# Patient Record
Sex: Male | Born: 1975 | Race: White | Hispanic: No | Marital: Married | Smoking: Former smoker
Health system: Southern US, Community
[De-identification: ages and names within clinical notes are randomized; demographics above are authoritative.]

## PROBLEM LIST (undated history)

## (undated) DIAGNOSIS — D649 Anemia, unspecified: Secondary | ICD-10-CM

## (undated) DIAGNOSIS — M940 Chondrocostal junction syndrome [Tietze]: Secondary | ICD-10-CM

## (undated) DIAGNOSIS — B019 Varicella without complication: Secondary | ICD-10-CM

## (undated) DIAGNOSIS — R51 Headache: Secondary | ICD-10-CM

## (undated) DIAGNOSIS — F419 Anxiety disorder, unspecified: Secondary | ICD-10-CM

## (undated) DIAGNOSIS — J45909 Unspecified asthma, uncomplicated: Secondary | ICD-10-CM

## (undated) DIAGNOSIS — K219 Gastro-esophageal reflux disease without esophagitis: Secondary | ICD-10-CM

## (undated) DIAGNOSIS — R519 Headache, unspecified: Secondary | ICD-10-CM

## (undated) HISTORY — PX: HERNIA REPAIR: SHX51

## (undated) HISTORY — PX: OTHER SURGICAL HISTORY: SHX169

---

## 2008-08-29 ENCOUNTER — Emergency Department: Payer: Self-pay | Admitting: Emergency Medicine

## 2008-09-01 ENCOUNTER — Emergency Department: Payer: Self-pay | Admitting: Emergency Medicine

## 2010-07-06 ENCOUNTER — Ambulatory Visit: Payer: Self-pay | Admitting: Gastroenterology

## 2010-07-10 LAB — PATHOLOGY REPORT

## 2012-06-05 ENCOUNTER — Ambulatory Visit: Payer: Self-pay | Admitting: Internal Medicine

## 2014-04-12 IMAGING — US ABDOMEN ULTRASOUND
1 series · 14 of 25 positions shown · non-contrast
Comparison: none

REASON FOR EXAM: Abd pain
COMMENTS:

PROCEDURE:     US  - US ABDOMEN GENERAL SURVEY  - June 05, 2012  [DATE]
RESULT:     Comparison: None
TECHNIQUE: Multiple gray-scale and color-flow Doppler images of the abdomen
are presented for review.

[Series 1: abdomen ultrasound · 0.31mm/px · 14 of 84 slices shown]
[im 1/84]
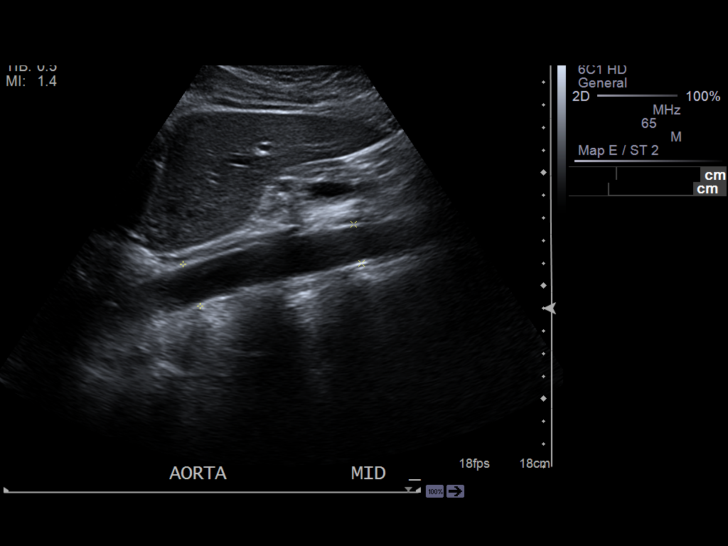
[im 7/84]
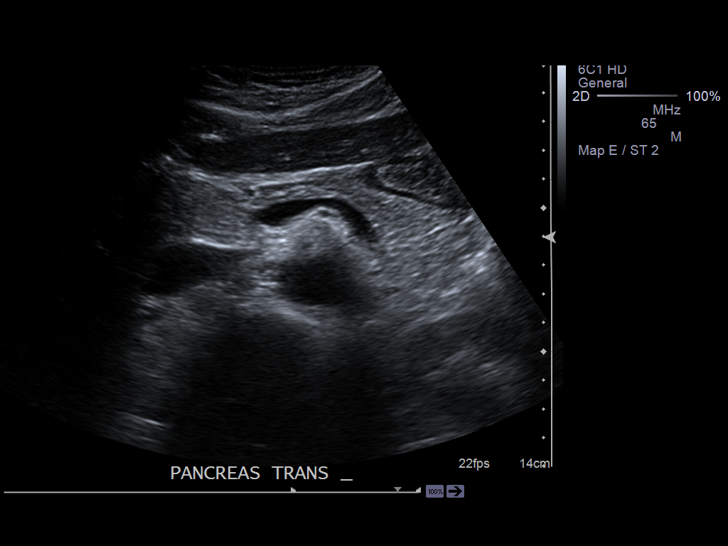
[im 14/84]
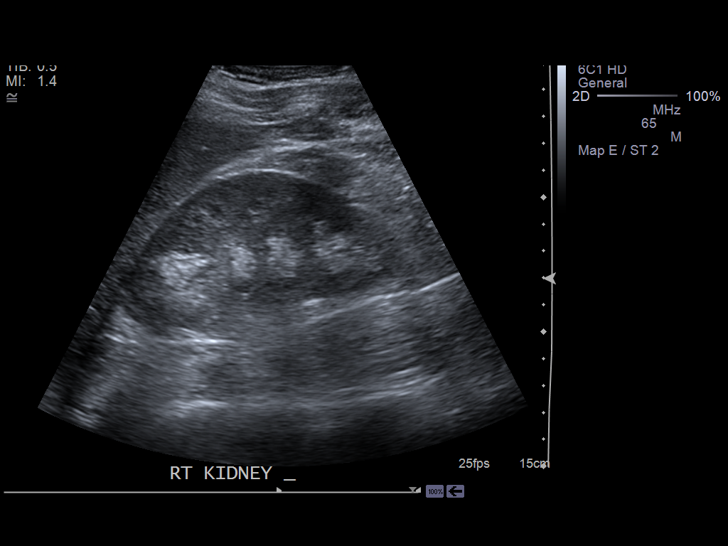
[im 21/84]
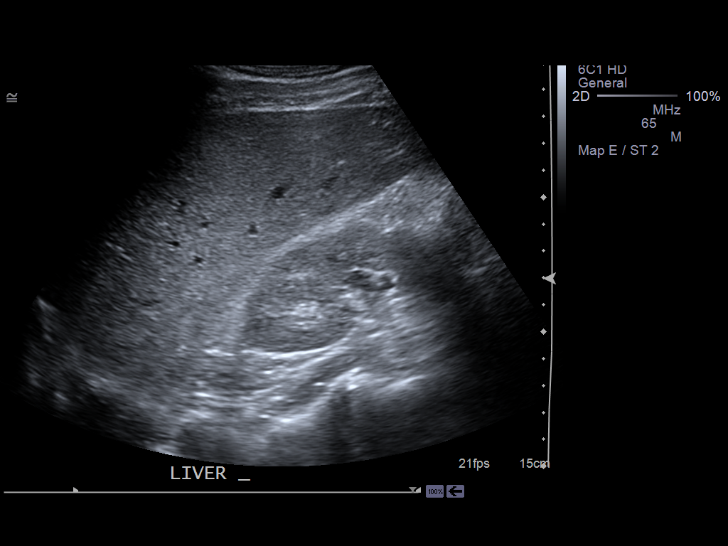
[im 28/84]
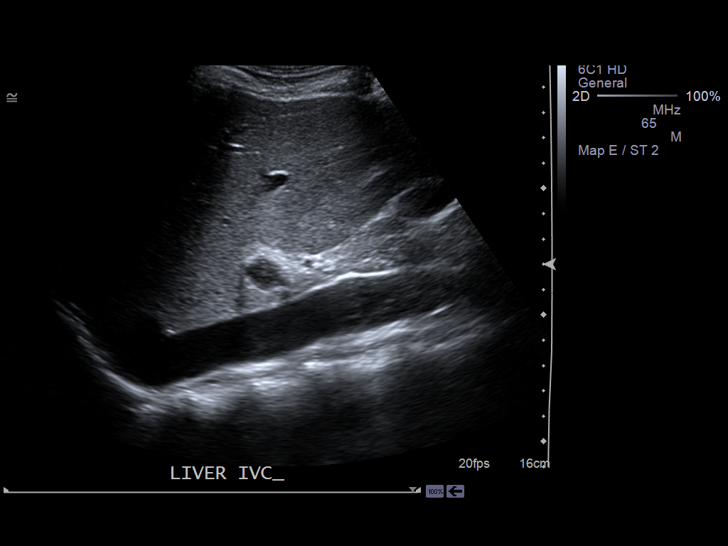
[im 32/84]
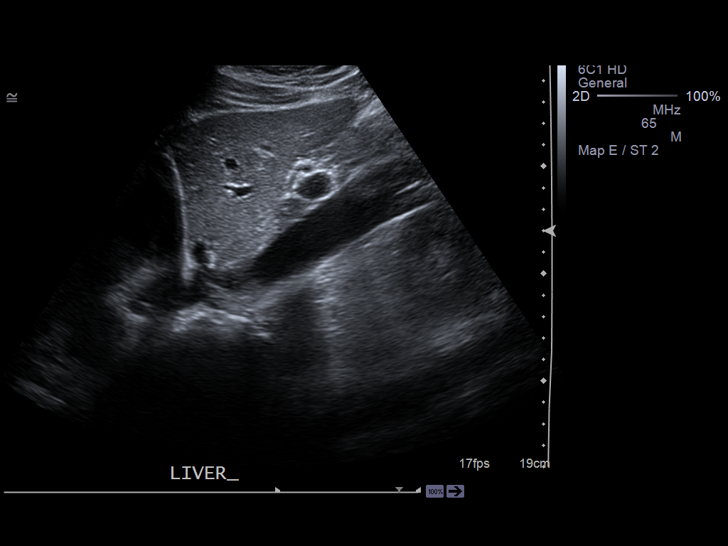
[im 39/84]
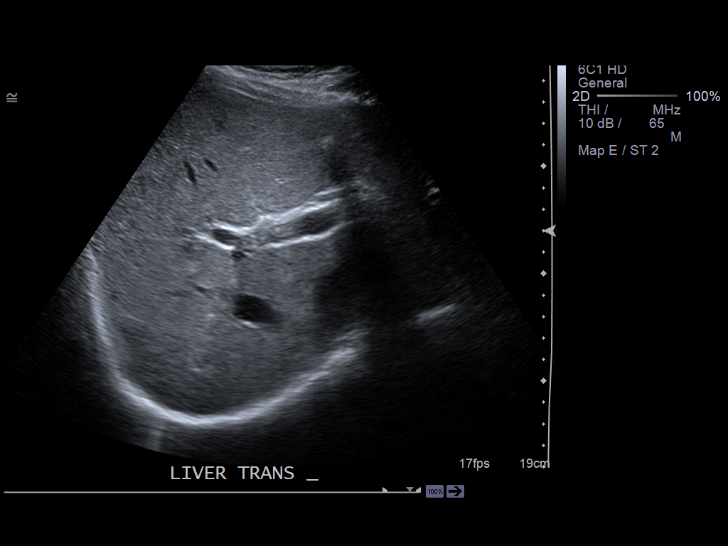
[im 45/84]
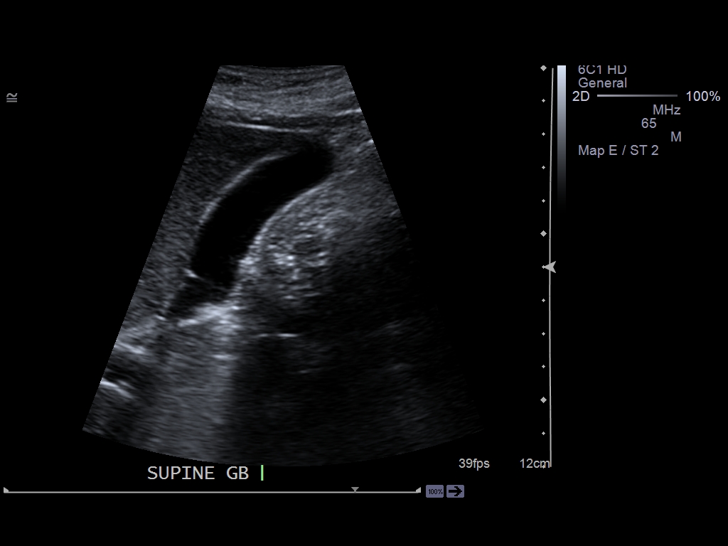
[im 52/84]
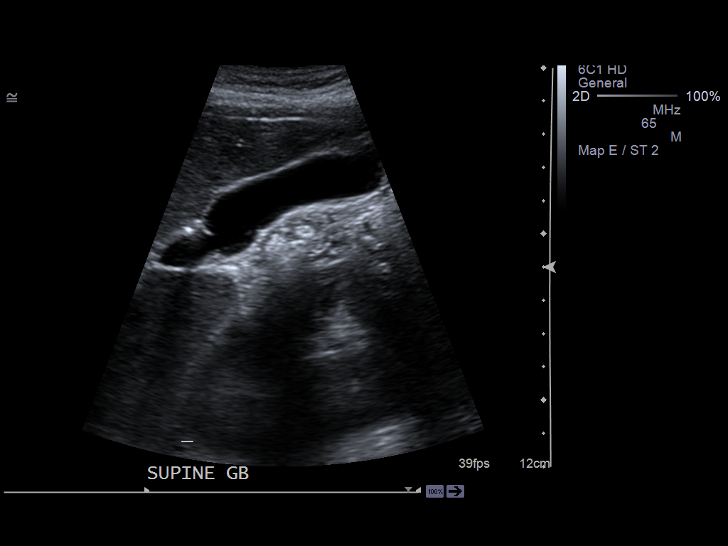
[im 56/84]
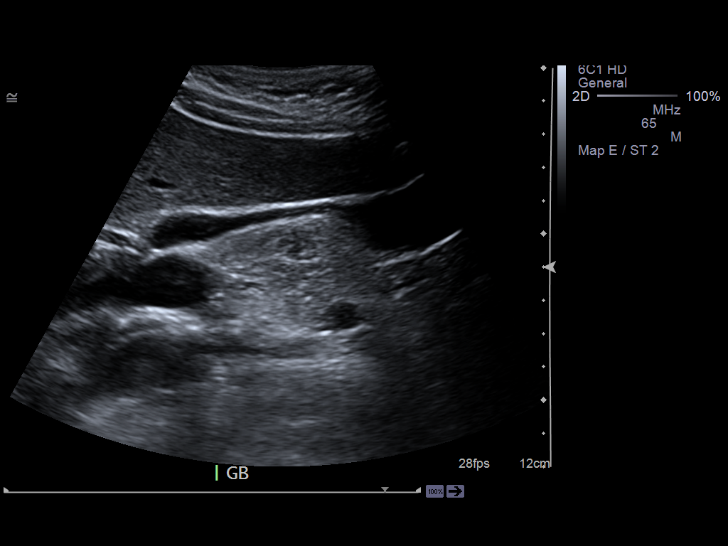
[im 63/84]
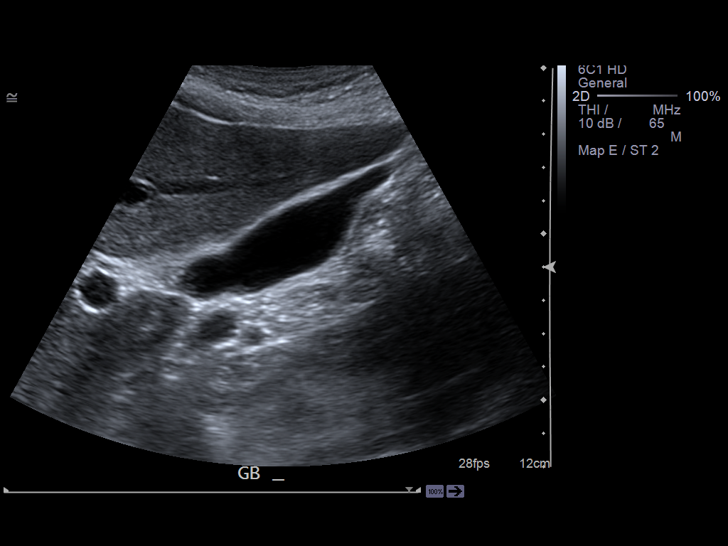
[im 70/84]
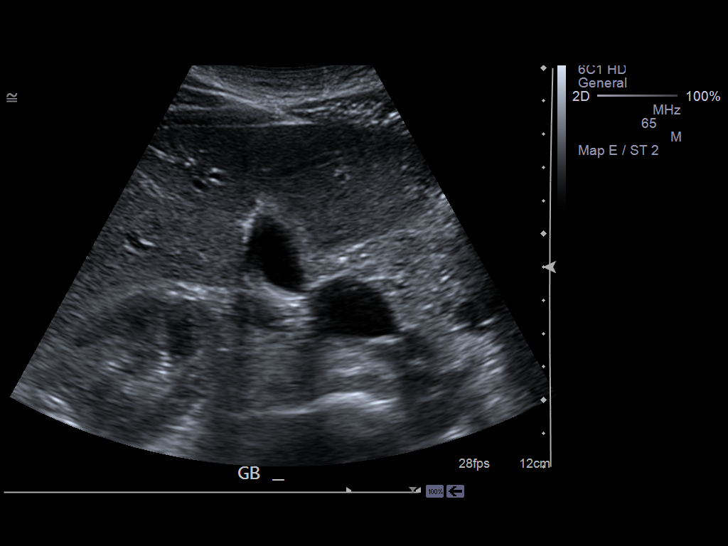
[im 77/84]
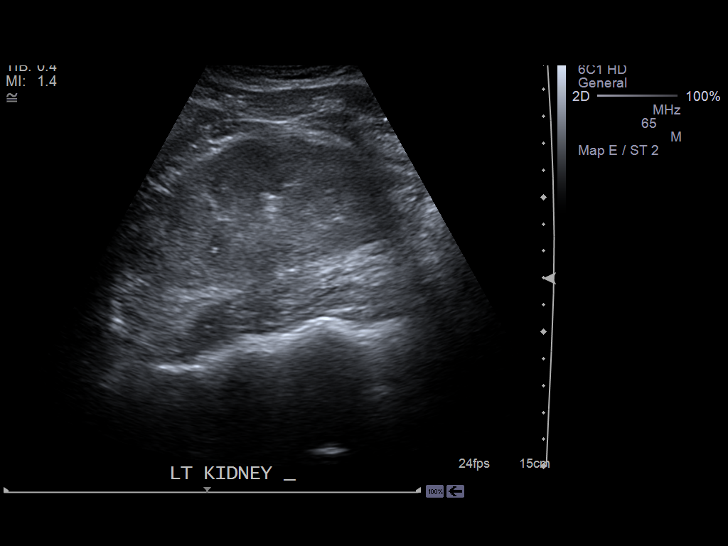
[im 84/84]
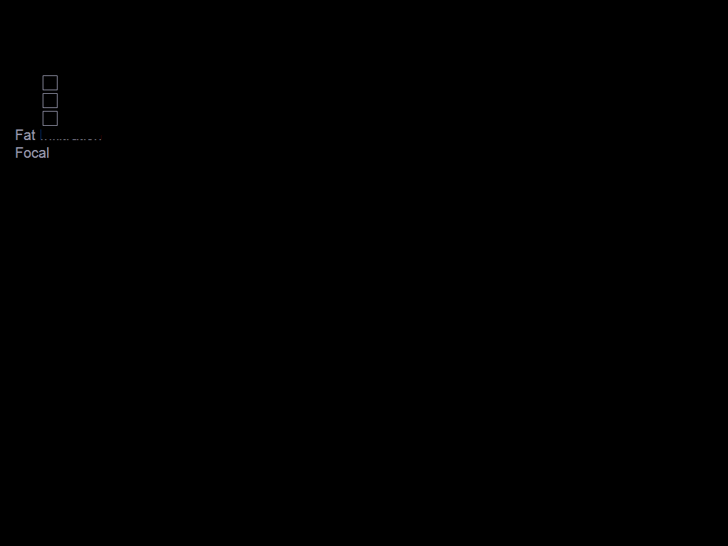

[14 of 25 positions shown; findings below may reference images not displayed]

FINDINGS: Visualized portions of the liver demonstrate normal echogenicity and normal
contours. The liver is without evidence of a focal hepatic lesion.

There is no cholelithiasis or biliary sludge. There is no intra- or
extrahepatic biliary ductal dilatation. The common duct measures 2.1 mm in
maximal diameter. There is no gallbladder wall thickening, pericholecystic
fluid, or sonographic Murphy's sign.

The visualized portion of the pancreas is normal in echogenicity. The spleen
is unremarkable. Bilateral kidneys are normal in echogenicity and size. The
right kidney measures 10.1 x 4.4 x 5.8 cm. The left kidney measures 10.6 x
6.3 x 6.3 cm. There are no renal calculi or hydronephrosis. The abdominal
aorta and IVC are unremarkable.
IMPRESSION: 1. Normal abdominal ultrasound.

[REDACTED]

## 2014-09-21 ENCOUNTER — Encounter (HOSPITAL_COMMUNITY): Payer: Self-pay | Admitting: *Deleted

## 2014-09-21 ENCOUNTER — Emergency Department (INDEPENDENT_AMBULATORY_CARE_PROVIDER_SITE_OTHER)
Admission: EM | Admit: 2014-09-21 | Discharge: 2014-09-21 | Disposition: A | Discharge status: Home / Self Care | Payer: 59 | Source: Home / Self Care | Attending: Family Medicine | Admitting: Family Medicine

## 2014-09-21 ENCOUNTER — Emergency Department (INDEPENDENT_AMBULATORY_CARE_PROVIDER_SITE_OTHER): Payer: 59

## 2014-09-21 DIAGNOSIS — W458XXA Other foreign body or object entering through skin, initial encounter: Secondary | ICD-10-CM

## 2014-09-21 DIAGNOSIS — S6982XA Other specified injuries of left wrist, hand and finger(s), initial encounter: Secondary | ICD-10-CM

## 2014-09-21 DIAGNOSIS — Z23 Encounter for immunization: Secondary | ICD-10-CM

## 2014-09-21 DIAGNOSIS — S6992XA Unspecified injury of left wrist, hand and finger(s), initial encounter: Secondary | ICD-10-CM

## 2014-09-21 HISTORY — DX: Unspecified asthma, uncomplicated: J45.909

## 2014-09-21 MED ORDER — TETANUS-DIPHTH-ACELL PERTUSSIS 5-2.5-18.5 LF-MCG/0.5 IM SUSP
0.5000 mL | Freq: Once | INTRAMUSCULAR | Status: AC
  Administered 2014-09-21: 0.5 mL via INTRAMUSCULAR

## 2014-09-21 MED ORDER — TETANUS-DIPHTH-ACELL PERTUSSIS 5-2.5-18.5 LF-MCG/0.5 IM SUSP
INTRAMUSCULAR | Status: AC
  Filled 2014-09-21: qty 0.5

## 2014-09-21 NOTE — ED Notes (Signed)
Foreign   Body       l  Pinky  Fish  Hook        Today    Needs  Tetanus  Shot

## 2014-09-21 NOTE — ED Provider Notes (Signed)
CSN: [id number]     Arrival date & time 09/21/14  1644 History   First MD Initiated Contact with Patient 09/21/14 1746     Chief Complaint  Patient presents with  . Foreign Body   (Consider location/radiation/quality/duration/timing/severity/associated sxs/prior Treatment) Patient is a 39 y.o. male presenting with foreign body. The history is provided by the patient.  Foreign Body Location:  Skin Suspected object: fish hook in left 5th finger after catching a fish. Pain severity:  Mild Progression:  Unchanged Chronicity:  New Worsened by:  Nothing tried Ineffective treatments:  None tried   Past Medical History  Diagnosis Date  . Asthma   . Thyroid disease    History reviewed. No pertinent past surgical history. History reviewed. No pertinent family history. History  Substance Use Topics  . Smoking status: Never Smoker   . Smokeless tobacco: Not on file  . Alcohol Use: No    Review of Systems  Constitutional: Negative.   Skin: Positive for wound.    Allergies  Avelox  Home Medications   Prior to Admission medications   Medication Sig Start Date End Date Taking? Authorizing Provider  Cariprazine HCl (VRAYLAR PO) Take by mouth.   Yes Historical Provider, MD  Levothyroxine Sodium (SYNTHROID PO) Take by mouth.   Yes Historical Provider, MD  Omeprazole (PRILOSEC PO) Take by mouth.   Yes Historical Provider, MD  Venlafaxine HCl (EFFEXOR PO) Take by mouth.   Yes Historical Provider, MD   There were no vitals taken for this visit. Physical Exam  Constitutional: He is oriented to person, place, and time. He appears well-developed and well-nourished.  Musculoskeletal: Normal range of motion.  Neurological: He is alert and oriented to person, place, and time.  Skin: Skin is warm and dry.  Single hook lure in distal phalanx of left 5th finger  Nursing note and vitals reviewed.   ED Course  FOREIGN BODY REMOVAL Date/Time: 09/21/2014 6:25 PM Performed by: Linna HoffKINDL, JAMES  D Authorized by: Bradd CanaryKINDL, JAMES D Consent: Verbal consent obtained. Consent given by: patient Body area: skin Anesthesia: local infiltration Local anesthetic: lidocaine 2% without epinephrine Patient sedated: no Patient restrained: no Patient cooperative: yes Localization method: visualized Removal mechanism: scalpel and hemostat Dressing: antibiotic ointment Tendon involvement: none Depth: subcutaneous Complexity: simple 1 objects recovered. Objects recovered: fish hook Post-procedure assessment: foreign body removed Patient tolerance: Patient tolerated the procedure well with no immediate complications Comments: hibiclens soaked, dsd.   (including critical care time) Labs Review Labs Reviewed - No data to display  Imaging Review No results found.   MDM   1. Fishhook injury to finger, left, initial encounter        Linna HoffJames D Kindl, MD 09/21/14 [phone]

## 2014-09-21 NOTE — Discharge Instructions (Signed)
Return if signs of infection in finger, you had a tetanus booster

## 2015-10-03 ENCOUNTER — Encounter: Payer: Self-pay | Admitting: *Deleted

## 2015-10-06 ENCOUNTER — Encounter
Admission: RE | Disposition: A | Discharge status: Home / Self Care | Payer: Self-pay | Source: Ambulatory Visit | Attending: Gastroenterology

## 2015-10-06 ENCOUNTER — Encounter: Payer: Self-pay | Admitting: *Deleted

## 2015-10-06 ENCOUNTER — Ambulatory Visit: Payer: 59 | Admitting: Anesthesiology

## 2015-10-06 ENCOUNTER — Ambulatory Visit
Admission: RE | Admit: 2015-10-06 | Discharge: 2015-10-06 | Disposition: A | Discharge status: Home / Self Care | Payer: 59 | Source: Ambulatory Visit | Attending: Gastroenterology | Admitting: Gastroenterology

## 2015-10-06 DIAGNOSIS — F419 Anxiety disorder, unspecified: Secondary | ICD-10-CM | POA: Diagnosis not present

## 2015-10-06 DIAGNOSIS — K210 Gastro-esophageal reflux disease with esophagitis: Secondary | ICD-10-CM | POA: Diagnosis not present

## 2015-10-06 DIAGNOSIS — E079 Disorder of thyroid, unspecified: Secondary | ICD-10-CM | POA: Diagnosis not present

## 2015-10-06 DIAGNOSIS — J45909 Unspecified asthma, uncomplicated: Secondary | ICD-10-CM | POA: Insufficient documentation

## 2015-10-06 DIAGNOSIS — R0789 Other chest pain: Secondary | ICD-10-CM | POA: Diagnosis present

## 2015-10-06 DIAGNOSIS — K219 Gastro-esophageal reflux disease without esophagitis: Secondary | ICD-10-CM | POA: Diagnosis present

## 2015-10-06 DIAGNOSIS — Z79899 Other long term (current) drug therapy: Secondary | ICD-10-CM | POA: Diagnosis not present

## 2015-10-06 DIAGNOSIS — K228 Other specified diseases of esophagus: Secondary | ICD-10-CM | POA: Insufficient documentation

## 2015-10-06 DIAGNOSIS — Z87891 Personal history of nicotine dependence: Secondary | ICD-10-CM | POA: Insufficient documentation

## 2015-10-06 HISTORY — DX: Headache: R51

## 2015-10-06 HISTORY — DX: Anemia, unspecified: D64.9

## 2015-10-06 HISTORY — DX: Anxiety disorder, unspecified: F41.9

## 2015-10-06 HISTORY — DX: Chondrocostal junction syndrome (tietze): M94.0

## 2015-10-06 HISTORY — PX: ESOPHAGOGASTRODUODENOSCOPY (EGD) WITH PROPOFOL: SHX5813

## 2015-10-06 HISTORY — DX: Headache, unspecified: R51.9

## 2015-10-06 HISTORY — DX: Varicella without complication: B01.9

## 2015-10-06 HISTORY — DX: Gastro-esophageal reflux disease without esophagitis: K21.9

## 2015-10-06 SURGERY — ESOPHAGOGASTRODUODENOSCOPY (EGD) WITH PROPOFOL
Anesthesia: General

## 2015-10-06 MED ORDER — MIDAZOLAM HCL 5 MG/5ML IJ SOLN
INTRAMUSCULAR | Status: DC | PRN
  Administered 2015-10-06: 1 mg via INTRAVENOUS

## 2015-10-06 MED ORDER — PROPOFOL 500 MG/50ML IV EMUL
INTRAVENOUS | Status: DC | PRN
  Administered 2015-10-06: 190 ug/kg/min via INTRAVENOUS

## 2015-10-06 MED ORDER — FENTANYL CITRATE (PF) 100 MCG/2ML IJ SOLN
INTRAMUSCULAR | Status: DC | PRN
  Administered 2015-10-06: 50 ug via INTRAVENOUS

## 2015-10-06 MED ORDER — LIDOCAINE 2% (20 MG/ML) 5 ML SYRINGE
INTRAMUSCULAR | Status: DC | PRN
  Administered 2015-10-06: 40 mg via INTRAVENOUS

## 2015-10-06 MED ORDER — SODIUM CHLORIDE 0.9 % IV SOLN
INTRAVENOUS | Status: DC
  Administered 2015-10-06: 10:00:00 via INTRAVENOUS

## 2015-10-06 MED ORDER — PROPOFOL 10 MG/ML IV BOLUS
INTRAVENOUS | Status: DC | PRN
  Administered 2015-10-06: 100 mg via INTRAVENOUS

## 2015-10-06 NOTE — Anesthesia Preprocedure Evaluation (Addendum)
Anesthesia Evaluation  Patient identified by MRN, date of birth, ID band Patient awake    Reviewed: Allergy & Precautions, H&P , NPO status , Patient's Chart, lab work & pertinent test results, reviewed documented beta blocker date and time   Airway Mallampati: II   Neck ROM: full    Dental  (+) Poor Dentition   Pulmonary neg pulmonary ROS, neg shortness of breath, asthma , former smoker,    Pulmonary exam normal        Cardiovascular negative cardio ROS Normal cardiovascular exam     Neuro/Psych  Headaches, Anxiety negative neurological ROS  negative psych ROS   GI/Hepatic negative GI ROS, Neg liver ROS, GERD  ,  Endo/Other  negative endocrine ROS  Renal/GU negative Renal ROS  negative genitourinary   Musculoskeletal   Abdominal   Peds  Hematology negative hematology ROS (+) anemia ,   Anesthesia Other Findings Past Medical History:   Asthma                                                       Thyroid disease                                              Anemia                                                       Anxiety                                                      Chicken pox                                                  Costochondritis                                              GERD (gastroesophageal reflux disease)                       Headache                                                     Thyroid disease                                            Past Surgical History:   HERNIA REPAIR  pilonidial                                                  BMI    Body Mass Index   22.95 kg/m 2     Reproductive/Obstetrics                            Anesthesia Physical Anesthesia Plan  ASA: III  Anesthesia Plan: General   Post-op Pain Management:    Induction:   Airway Management Planned:   Additional Equipment:    Intra-op Plan:   Post-operative Plan:   Informed Consent: I have reviewed the patients History and Physical, chart, labs and discussed the procedure including the risks, benefits and alternatives for the proposed anesthesia with the patient or authorized representative who has indicated his/her understanding and acceptance.   Dental Advisory Given  Plan Discussed with: CRNA  Anesthesia Plan Comments:         Anesthesia Quick Evaluation

## 2015-10-06 NOTE — Anesthesia Postprocedure Evaluation (Signed)
Anesthesia Post Note  Patient: Micheal Watts  Procedure(s) Performed: Procedure(s) (LRB): ESOPHAGOGASTRODUODENOSCOPY (EGD) WITH PROPOFOL (N/A)  Patient location during evaluation: PACU Anesthesia Type: General Level of consciousness: awake and alert Pain management: pain level controlled Vital Signs Assessment: post-procedure vital signs reviewed and stable Respiratory status: spontaneous breathing, nonlabored ventilation, respiratory function stable and patient connected to nasal cannula oxygen Cardiovascular status: blood pressure returned to baseline and stable Postop Assessment: no signs of nausea or vomiting Anesthetic complications: no    Last Vitals:  Filed Vitals:   10/06/15 1132 10/06/15 1142  BP: 97/72 111/74  Pulse: 75 68  Temp:    Resp: 17 20    Last Pain:  Filed Vitals:   10/06/15 1155  PainSc: Asleep                 Yevette EdwardsJames G Adams

## 2015-10-06 NOTE — H&P (Signed)
    Primary Care Physician:  Rozanna BoxBABAOFF, MARC E, MD Primary Gastroenterologist:  Dr. Bluford Kaufmannh  Pre-Procedure History & Physical: HPI:  Micheal Watts is a 40 y.o. male is here for an EGD   Past Medical History  Diagnosis Date  . Asthma   . Thyroid disease   . Anemia   . Anxiety   . Chicken pox   . Costochondritis   . GERD (gastroesophageal reflux disease)   . Headache   . Thyroid disease     Past Surgical History  Procedure Laterality Date  . Hernia repair    . Pilonidial       Prior to Admission medications   Medication Sig Start Date End Date Taking? Authorizing Provider  cyclobenzaprine (FLEXERIL) 10 MG tablet Take 10 mg by mouth 3 (three) times daily as needed for muscle spasms.   Yes Historical Provider, MD  nortriptyline (PAMELOR) 25 MG capsule Take 25 mg by mouth at bedtime.   Yes Historical Provider, MD  pantoprazole (PROTONIX) 40 MG tablet Take 40 mg by mouth daily.   Yes Historical Provider, MD  ranitidine (ZANTAC) 150 MG tablet Take 150 mg by mouth 2 (two) times daily.   Yes Historical Provider, MD  Cariprazine HCl (VRAYLAR PO) Take by mouth.    Historical Provider, MD  Levothyroxine Sodium (SYNTHROID PO) Take by mouth.    Historical Provider, MD  Omeprazole (PRILOSEC PO) Take by mouth.    Historical Provider, MD  Venlafaxine HCl (EFFEXOR PO) Take by mouth.    Historical Provider, MD    Allergies as of 09/25/2015 - Review Complete 09/21/2014  Allergen Reaction Noted  . Avelox [moxifloxacin]  09/21/2014    History reviewed. No pertinent family history.  Social History   Social History  . Marital Status: Married    Spouse Name: N/A  . Number of Children: N/A  . Years of Education: N/A   Occupational History  . Not on file.   Social History Main Topics  . Smoking status: Former Games developermoker  . Smokeless tobacco: Never Used  . Alcohol Use: No  . Drug Use: Not on file  . Sexual Activity: Not on file   Other Topics Concern  . Not on file   Social History  Narrative    Review of Systems: See HPI, otherwise negative ROS  Physical Exam: There were no vitals taken for this visit. General:   Alert,  pleasant and cooperative in NAD Head:  Normocephalic and atraumatic. Neck:  Supple; no masses or thyromegaly. Lungs:  Clear throughout to auscultation.    Heart:  Regular rate and rhythm. Abdomen:  Soft, nontender and nondistended. Normal bowel sounds, without guarding, and without rebound.   Neurologic:  Alert and  oriented x4;  grossly normal neurologically.  Impression/Plan: Micheal Watts is here for an EGD to be performed for atypical chest pain  Risks, benefits, limitations, and alternatives regarding EGD have been reviewed with the patient.  Questions have been answered.  All parties agreeable.   [name], Ezzard StandingPAUL Y, MD  10/06/2015, 10:03 AM

## 2015-10-06 NOTE — Transfer of Care (Signed)
Immediate Anesthesia Transfer of Care Note  Patient: Micheal Watts  Procedure(s) Performed: Procedure(s): ESOPHAGOGASTRODUODENOSCOPY (EGD) WITH PROPOFOL (N/A)  Patient Location: PACU and Endoscopy Unit  Anesthesia Type:General  Level of Consciousness: sedated  Airway & Oxygen Therapy: Patient Spontanous Breathing and Patient connected to nasal cannula oxygen  Post-op Assessment: Report given to RN and Post -op Vital signs reviewed and stable  Post vital signs: Reviewed and stable  Last Vitals:  Filed Vitals:   10/06/15 1010  BP: 115/64  Pulse: 75  Temp: 36.6 C  Resp: 18    Last Pain: There were no vitals filed for this visit.       Complications: No apparent anesthesia complications

## 2015-10-06 NOTE — Op Note (Signed)
[facility] Gastroenterology Patient Name: Micheal Watts Procedure Date: 10/06/2015 10:45 AM MRN: [id number] Account #: [id number] Date of Birth: 03-09-1976 Admit Type: Outpatient Age: 40 Room: [facility] ENDO ROOM 4 Gender: Male Note Status: Finalized Procedure:            Upper GI endoscopy Indications:          Suspected esophageal reflux, Chest pain (non cardiac) Providers:            Ezzard Standing. Bluford Kaufmann, MD Referring MD:         Hassell Halim (Referring MD) Medicines:            Monitored Anesthesia Care Complications:        No immediate complications. Procedure:            Pre-Anesthesia Assessment:                       - Prior to the procedure, a History and Physical was                        performed, and patient medications, allergies and                        sensitivities were reviewed. The patient's tolerance of                        previous anesthesia was reviewed.                       - The risks and benefits of the procedure and the                        sedation options and risks were discussed with the                        patient. All questions were answered and informed                        consent was obtained.                       - After reviewing the risks and benefits, the patient                        was deemed in satisfactory condition to undergo the                        procedure.                       After obtaining informed consent, the endoscope was                        passed under direct vision. Throughout the procedure,                        the patient's blood pressure, pulse, and oxygen                        saturations were monitored continuously. The  Colonoscope was introduced through the mouth, and                        advanced to the second part of duodenum. The upper GI                        endoscopy was accomplished without difficulty. The                        patient tolerated  the procedure well. Findings:      The Z-line was irregular and was found at the gastroesophageal junction.       4 quadrant biopsies were taken with a cold forceps for histology.      The exam was otherwise without abnormality.      The entire examined stomach was normal.      The examined duodenum was normal. Impression:           - Z-line irregular, at the gastroesophageal junction.                        Biopsied.                       - The examination was otherwise normal.                       - Normal stomach.                       - Normal examined duodenum. Recommendation:       - Discharge patient to home.                       - Observe patient's clinical course.                       - Continue present medications.                       - Await pathology results.                       - The findings and recommendations were discussed with                        the patient. Procedure Code(s):    --- Professional ---                       [phone], Esophagogastroduodenoscopy, flexible, transoral;                        with biopsy, single or multiple Diagnosis Code(s):    --- Professional ---                       K22.8, Other specified diseases of esophagus                       R07.89, Other chest pain CPT copyright 2016 American Medical Association. All rights reserved. The codes documented in this report are preliminary and upon coder review may  be revised to meet current compliance requirements. Wallace Cullens, MD 10/06/2015 10:57:45 AM This report has been signed electronically. Number of  Addenda: 0 Note Initiated On: 10/06/2015 10:45 AM      [facility]

## 2015-10-07 LAB — SURGICAL PATHOLOGY

## 2015-10-08 ENCOUNTER — Encounter: Payer: Self-pay | Admitting: Gastroenterology

## 2016-06-22 DIAGNOSIS — Z79899 Other long term (current) drug therapy: Secondary | ICD-10-CM | POA: Diagnosis not present

## 2016-06-22 DIAGNOSIS — E039 Hypothyroidism, unspecified: Secondary | ICD-10-CM | POA: Diagnosis not present

## 2016-06-24 DIAGNOSIS — Z79899 Other long term (current) drug therapy: Secondary | ICD-10-CM | POA: Diagnosis not present

## 2016-06-24 DIAGNOSIS — E039 Hypothyroidism, unspecified: Secondary | ICD-10-CM | POA: Diagnosis not present

## 2016-06-24 DIAGNOSIS — Z13220 Encounter for screening for lipoid disorders: Secondary | ICD-10-CM | POA: Diagnosis not present

## 2016-07-20 DIAGNOSIS — E063 Autoimmune thyroiditis: Secondary | ICD-10-CM | POA: Diagnosis not present

## 2016-07-20 DIAGNOSIS — R002 Palpitations: Secondary | ICD-10-CM | POA: Diagnosis not present

## 2016-07-26 DIAGNOSIS — I491 Atrial premature depolarization: Secondary | ICD-10-CM | POA: Diagnosis not present

## 2016-07-26 DIAGNOSIS — R002 Palpitations: Secondary | ICD-10-CM | POA: Diagnosis not present

## 2016-07-28 IMAGING — DX DG FINGER LITTLE 2+V*L*
3 series · 3 of 3 positions shown · non-contrast
Comparison: None.

CLINICAL DATA: Fish hook in left little finger

EXAM:
LEFT LITTLE FINGER 2+V

[finger ap]
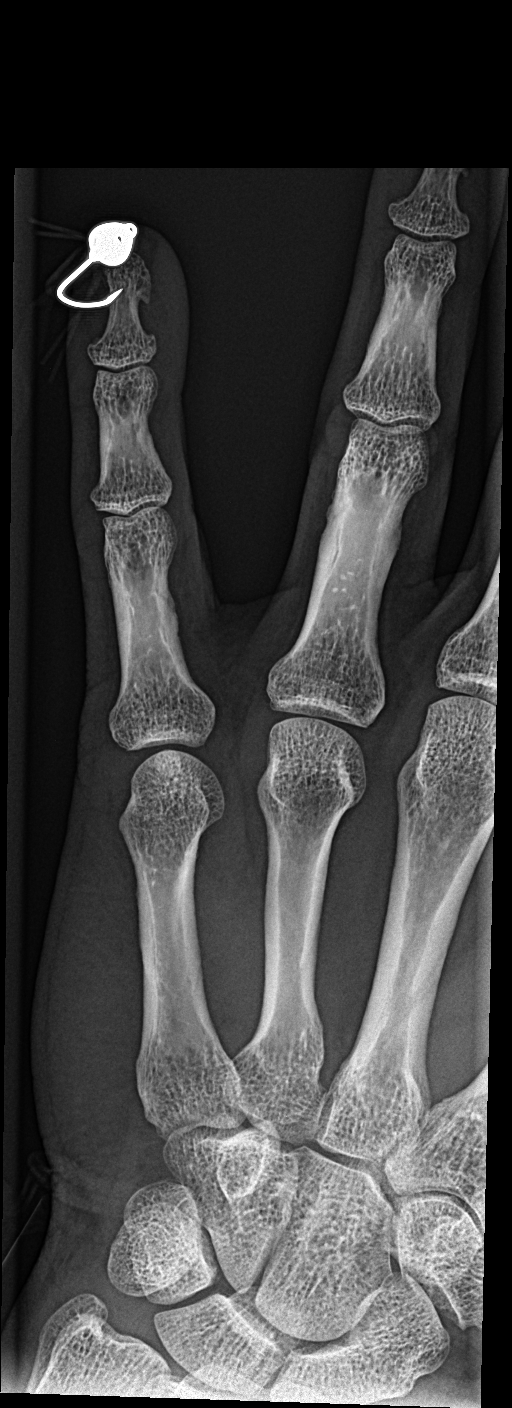

[finger obl]
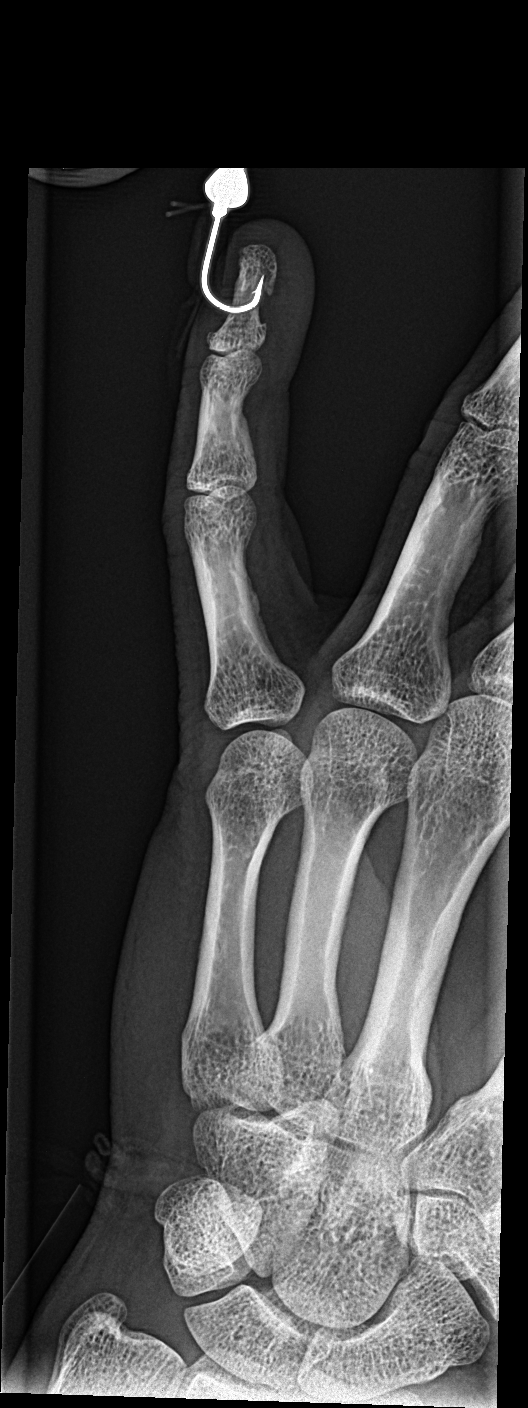

[finger lat]
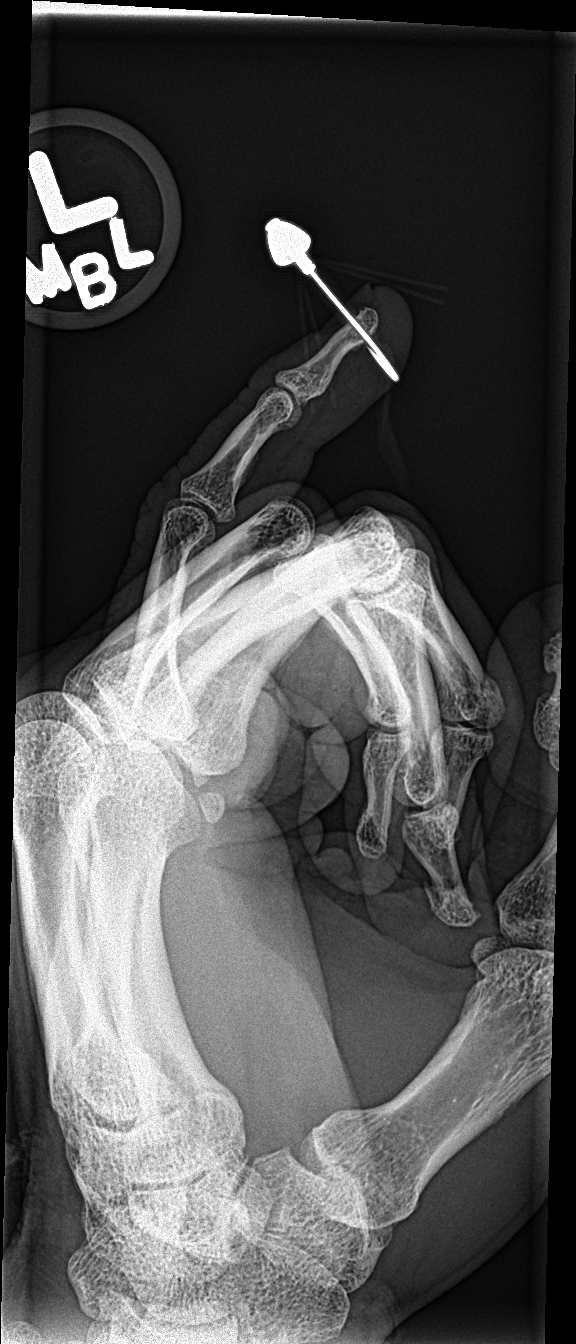

[3 of 3 positions shown; findings below may reference images not displayed]

FINDINGS: Radiopaque foreign body (fish hook) in the soft tissues of the
distal 5th digit.

No associated fracture is seen.

The joint spaces are preserved.
IMPRESSION: Radiopaque foreign body (fish hook) in the soft tissues of the
distal 5th digit.

No associated fracture is seen.

## 2016-09-13 ENCOUNTER — Ambulatory Visit: Payer: 59 | Admitting: Internal Medicine

## 2016-10-21 ENCOUNTER — Encounter: Payer: Self-pay | Admitting: Internal Medicine

## 2016-10-21 ENCOUNTER — Ambulatory Visit (INDEPENDENT_AMBULATORY_CARE_PROVIDER_SITE_OTHER): Payer: 59 | Admitting: Internal Medicine

## 2016-10-21 VITALS — BP 128/82 | HR 73 | Ht 69.0 in | Wt 159.0 lb

## 2016-10-21 DIAGNOSIS — E039 Hypothyroidism, unspecified: Secondary | ICD-10-CM | POA: Diagnosis not present

## 2016-10-21 LAB — TSH: TSH: 6.86 u[IU]/mL — ABNORMAL HIGH (ref 0.35–4.50)

## 2016-10-21 LAB — T4, FREE: Free T4: 0.93 ng/dL (ref 0.60–1.60)

## 2016-10-21 MED ORDER — LEVOTHYROXINE SODIUM 112 MCG PO TABS: 112.0000 ug | ORAL_TABLET | Freq: Every day | ORAL | 1 refills | Status: DC

## 2016-10-21 NOTE — Patient Instructions (Signed)
Please stop at the lab.  Please continue Levothyroxine 125 alternating with 135 mcg every other day.  Take the thyroid hormone every day, with water, at least 30 minutes before breakfast, separated by at least 4 hours from: - acid reflux medications - calcium - iron - multivitamins  Please come back for labs in 1.5 months and for a visit in 4 months.

## 2016-10-21 NOTE — Progress Notes (Signed)
Patient ID: Micheal Watts, male   DOB: 1976-05-05, 41 y.o.   MRN: [id number]    HPI  Micheal Watts is a 41 y.o.-year-old male, referred by his PCP, Dr. Larwance Sachs for management of uncontrolled hypothyroidism.  Pt. has been dx with hypothyroidism in 2003-05 (possibly 2/2 Graves ds.) >> now on Levothyroxine 137 mcg alternating with 125 mcg daily in 05/2016. He tells me he felt better on the 125 mcg daily.  He takes the thyroid hormone: - fasting - with water - + coffee - black after LT4 - separated by 30-45 min from b'fast  - no calcium, iron - + multivitamins occas.at lunch - + PPI with LT4 - no Biotin  I reviewed pt's thyroid tests - he is TSH was mostly suppressed, except for last check 4 months ago:  No results found for: TSH, FREET4   Pt describes: - weight gain - fatigue - cold intolerance - depression - constipation - dry skin - hair loss  Pt denies feeling nodules in neck, hoarseness, dysphagia/odynophagia, SOB with lying down.  She has + FH of thyroid disorders in: M and daughter. No FH of thyroid cancer.  No h/o radiation tx to head or neck. No recent use of iodine supplements.  Pt. also has a history of PACs, palpitations, GAD.  ROS: Constitutional: no weight gain/loss, + fatigue, + subjective hyperthermia Eyes: no blurry vision, no xerophthalmia ENT: no sore throat, no nodules palpated in throat, no dysphagia/odynophagia, no hoarseness Cardiovascular: + improved CP (anxiety-related) /no SOB/+ palpitations/no leg swelling Respiratory: no cough/SOB Gastrointestinal: no N/V/D/ + C/+ acid reflux  Musculoskeletal: + muscle/+ joint aches Skin: no rashes Neurological: no tremors/numbness/tingling/dizziness, + HA Psychiatric: + depression/+ anxiety  Past Medical History:  Diagnosis Date  . Anemia   . Anxiety   . Asthma   . Chicken pox   . Costochondritis   . GERD (gastroesophageal reflux disease)   . Headache   . Thyroid disease   . Thyroid  disease    Past Surgical History:  Procedure Laterality Date  . ESOPHAGOGASTRODUODENOSCOPY (EGD) WITH PROPOFOL N/A 10/06/2015   Procedure: ESOPHAGOGASTRODUODENOSCOPY (EGD) WITH PROPOFOL;  Surgeon: Wallace Cullens, MD;  Location: [facility] ENDOSCOPY;  Service: Gastroenterology;  Laterality: N/A;  . HERNIA REPAIR    . pilonidial      Social History   Social History  . Marital status: Married    Spouse name: N/A  . 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 Smoking status: Former Games developer, quit in 2003   . Smokeless tobacco: Never Used  . Alcohol use No  . Drug use: No   Current Outpatient Prescriptions on File Prior to Visit  Medication Sig Dispense Refill  . Levothyroxine Sodium (SYNTHROID PO) Take by mouth.    . pantoprazole (PROTONIX) 40 MG tablet Take 40 mg by mouth daily.    . Cariprazine HCl (VRAYLAR PO) Take by mouth.    . cyclobenzaprine (FLEXERIL) 10 MG tablet Take 10 mg by mouth 3 (three) times daily as needed for muscle spasms.    . nortriptyline (PAMELOR) 25 MG capsule Take 25 mg by mouth at bedtime.    . Omeprazole (PRILOSEC PO) Take by mouth.    . ranitidine (ZANTAC) 150 MG tablet Take 150 mg by mouth 2 (two) times daily.    . Venlafaxine HCl (EFFEXOR PO) Take by mouth.     No current facility-administered medications on file prior to  visit.    Allergies  Allergen Reactions  . Avelox [Moxifloxacin]    No family history on file.  PE: BP 128/82 (BP Location: Left Arm, Patient Position: Sitting)   Pulse 73   Ht 5\' 9"  (1.753 m)   Wt 159 lb (72.1 kg)   SpO2 96%   BMI 23.48 kg/m  Wt Readings from Last 3 Encounters:  10/21/16 159 lb (72.1 kg)  10/06/15 160 lb (72.6 kg)   Constitutional:normal weight, in NAD Eyes: PERRLA, EOMI, no exophthalmos ENT: moist mucous membranes, no thyromegaly, no cervical lymphadenopathy Cardiovascular: RRR, No MRG Respiratory: CTA B Gastrointestinal: abdomen soft, NT, ND,  BS+ Musculoskeletal: no deformities, strength intact in all 4 Skin: moist, warm, no rashes Neurological: no tremor with outstretched hands, DTR normal in all 4  ASSESSMENT: 1. Uncontrolled Hypothyroidism  PLAN:  1. Patient with long-standing hypothyroidism, Possibly developed after spontaneous remission of Graves' disease, on levothyroxine therapy. He has had fluctuating TFTs in the past, with a TSH mostly suppressed. He also has a history   of palpitations (with PACs), anxiety, hot flashes. We discussed that these may be signs of over replacement with levothyroxine.   - he does not appear to have a goiter, thyroid nodules, or neck compression symptoms - We discussed about correct intake of levothyroxine, fasting, with water, separated by at least 30 minutes from breakfast, and separated by more than 4 hours from calcium, iron, multivitamins, acid reflux medications (PPIs). He is not taking the medication correctly. He takes levothyroxine at the same time with his Protonix and I explained that this greatly reduces the absorption of his levothyroxine. I advised him to move Protonix at least 4 hours after taking his thyroid medication. However, if we do this, we also need to decrease the dose of his levothyroxine to avoid thyrotoxicosis. However, I would first  like to obtain a new set of thyroid tests today: TSH, free T4. Will decide about the new levothyroxine dose after the results are back. - he will need to return in ~6 weeks for repeat labs - I will see him back in 4 months  Office Visit on 10/21/2016  Component Date Value Ref Range Status  . Free T4 10/21/2016 0.93  0.60 - 1.60 ng/dL Final   Comment: Specimens from patients who are undergoing biotin therapy and /or ingesting biotin supplements may contain high levels of biotin.  The higher biotin concentration in these specimens interferes with this Free T4 assay.  Specimens that contain high levels  of biotin may cause false high results for  this Free T4 assay.  Please interpret results in light of the total clinical presentation of the patient.    Marland Kitchen. TSH 10/21/2016 6.86* 0.35 - 4.50 uIU/mL Final   His TSH is still high. Will decrease the dose of his levothyroxine only to 112 g daily and separate it from Protonix and then we will check another set of labs in 1.5 months.  Carlus Pavlovristina Clayton Bosserman, MD PhD [facility]

## 2016-10-22 ENCOUNTER — Telehealth: Payer: Self-pay | Admitting: Internal Medicine

## 2016-10-22 NOTE — Telephone Encounter (Signed)
Pt informed and verbalizes understanding

## 2016-10-22 NOTE — Telephone Encounter (Signed)
Please call patient to advise on lab results once received. I told him they would not be ready until probably mid next week. Patient also has had difficulty with mychart, please advise.

## 2016-10-22 NOTE — Telephone Encounter (Signed)
Festus Barrenanesha, can you please let him know:  I sent him this msg through MyChart yesterday:  Written by Carlus PavlovGherghe, Indi Willhite, MD on 10/21/2016 5:47 PM  Dear Micheal Watts,  His TSH is still high, but lower than before. Let's decrease the dose of levothyroxine to 112 g daily and separate it from Protonix and then check another set of labs in 1.5 months, as we discussed.  Please call our main office number [phone]) to schedule a lab appointment.  Sincerely,  Carlus Pavlovristina Kasandra Fehr MD

## 2016-12-02 ENCOUNTER — Other Ambulatory Visit (INDEPENDENT_AMBULATORY_CARE_PROVIDER_SITE_OTHER): Payer: 59

## 2016-12-02 ENCOUNTER — Encounter: Payer: Self-pay | Admitting: Internal Medicine

## 2016-12-02 DIAGNOSIS — E039 Hypothyroidism, unspecified: Secondary | ICD-10-CM | POA: Diagnosis not present

## 2016-12-02 LAB — T4, FREE: FREE T4: 0.74 ng/dL (ref 0.60–1.60)

## 2016-12-02 LAB — TSH: TSH: 10.13 u[IU]/mL — ABNORMAL HIGH (ref 0.35–4.50)

## 2016-12-03 ENCOUNTER — Other Ambulatory Visit: Payer: Self-pay | Admitting: Internal Medicine

## 2016-12-03 ENCOUNTER — Encounter: Payer: Self-pay | Admitting: Internal Medicine

## 2016-12-03 DIAGNOSIS — E039 Hypothyroidism, unspecified: Secondary | ICD-10-CM

## 2016-12-03 MED ORDER — LEVOTHYROXINE SODIUM 125 MCG PO TABS: 125.0000 ug | ORAL_TABLET | Freq: Every day | ORAL | 1 refills | Status: DC

## 2016-12-29 ENCOUNTER — Encounter: Payer: Self-pay | Admitting: Internal Medicine

## 2016-12-29 ENCOUNTER — Other Ambulatory Visit: Payer: Self-pay | Admitting: Internal Medicine

## 2016-12-29 MED ORDER — LEVOTHYROXINE SODIUM 125 MCG PO TABS: 125.0000 ug | ORAL_TABLET | Freq: Every day | ORAL | 1 refills | Status: DC

## 2016-12-30 ENCOUNTER — Other Ambulatory Visit: Payer: Self-pay

## 2016-12-30 MED ORDER — LEVOTHYROXINE SODIUM 125 MCG PO TABS: 125.0000 ug | ORAL_TABLET | Freq: Every day | ORAL | 1 refills | Status: DC

## 2017-01-17 ENCOUNTER — Other Ambulatory Visit (INDEPENDENT_AMBULATORY_CARE_PROVIDER_SITE_OTHER): Payer: 59

## 2017-01-17 DIAGNOSIS — E039 Hypothyroidism, unspecified: Secondary | ICD-10-CM | POA: Diagnosis not present

## 2017-01-17 LAB — T4, FREE: Free T4: 0.87 ng/dL (ref 0.60–1.60)

## 2017-01-17 LAB — TSH: TSH: 13.29 u[IU]/mL — ABNORMAL HIGH (ref 0.35–4.50)

## 2017-01-18 MED ORDER — LEVOTHYROXINE SODIUM 137 MCG PO TABS: 137.0000 ug | ORAL_TABLET | Freq: Every day | ORAL | 1 refills | Status: DC

## 2017-02-21 ENCOUNTER — Encounter: Payer: Self-pay | Admitting: Internal Medicine

## 2017-02-21 ENCOUNTER — Other Ambulatory Visit (INDEPENDENT_AMBULATORY_CARE_PROVIDER_SITE_OTHER): Payer: 59

## 2017-02-21 ENCOUNTER — Ambulatory Visit (INDEPENDENT_AMBULATORY_CARE_PROVIDER_SITE_OTHER): Payer: 59 | Admitting: Internal Medicine

## 2017-02-21 VITALS — BP 110/62 | HR 75 | Wt 169.0 lb

## 2017-02-21 DIAGNOSIS — E039 Hypothyroidism, unspecified: Secondary | ICD-10-CM

## 2017-02-21 LAB — T4, FREE: Free T4: 1 ng/dL (ref 0.60–1.60)

## 2017-02-21 LAB — TSH: TSH: 9.76 u[IU]/mL — ABNORMAL HIGH (ref 0.35–4.50)

## 2017-02-21 MED ORDER — SYNTHROID 137 MCG PO TABS: 137.0000 ug | ORAL_TABLET | Freq: Every day | ORAL | 1 refills | Status: DC

## 2017-02-21 NOTE — Patient Instructions (Signed)
Please stop at the lab.  Please continue Synthroid 137 mcg daily.  Take the thyroid hormone every day, with water, at least 30 minutes before breakfast, separated by at least 4 hours from: - acid reflux medications - calcium - iron - multivitamins  Please come back for a follow-up appointment in 6 months. 

## 2017-02-21 NOTE — Progress Notes (Signed)
Patient ID: Micheal Watts, male   DOB: 19-Jul-1975, 41 y.o.   MRN: [id number]    HPI  ANDEN BARTOLO is a 41 y.o.-year-old male, initially referred by his PCP, Dr. Larwance Sachs, returning for f/u for uncontrolled hypothyroidism. Last visit 4 mo ago.  Reviewed and addended history: Pt. has been dx with hypothyroidism in 2003-05 (possibly 2/2 Graves ds.) >> Synthroid DAW 137 mcg alternating with 125 mcg daily in 05/2016.   At last visit, we decrease LT4 to 112 mcg and separated it from Protonix, however, we had to increase the dose to 125 mcg daily in 11/2016 and then to 137 mcg daily in 12/2016.  Pt is now on Synthroid DAW 137 mcg daily, taken: - in am - fasting - at least 30 min from b'fast - no Ca, Fe, MVI - not on Biotin - we moved PPIs later in the day (4h later)  I reviewed pt's thyroid tests: Lab Results  Component Value Date   TSH 13.29 (H) 01/17/2017   TSH 10.13 (H) 12/02/2016   TSH 6.86 (H) 10/21/2016   FREET4 0.87 01/17/2017   FREET4 0.74 12/02/2016   FREET4 0.93 10/21/2016     Pt denies: - feeling nodules in neck - hoarseness - dysphagia - choking - SOB with lying down  She has + FH of thyroid disorders in: M and daughter. No FH of thyroid cancer. No h/o radiation tx to head or neck.  No seaweed or kelp. No recent contrast studies. No herbal supplements. No Biotin use. No recent steroids use.   Pt. also has a history of PACs, palpitations, GAD.  He is on Propranolol prn for anxiety/palpitations.  ROS: Constitutional: + weight gain/no weight loss, + fatigue, no subjective hyperthermia, no subjective hypothermia Eyes: + blurry vision, no xerophthalmia ENT: no sore throat, + see HPI Cardiovascular: no CP/no SOB/no palpitations/no leg swelling Respiratory: no cough/no SOB/no wheezing Gastrointestinal: no N/no V/no D/no C/+ acid reflux Musculoskeletal: no muscle aches/no joint aches Skin: no rashes, no hair loss Neurological: no tremors/no numbness/no  tingling/no dizziness, + HA  I reviewed pt's medications, allergies, PMH, social hx, family hx, and changes were documented in the history of present illness. Otherwise, unchanged from my initial visit note.   Past Medical History:  Diagnosis Date  . Anemia   . Anxiety   . Asthma   . Chicken pox   . Costochondritis   . GERD (gastroesophageal reflux disease)   . Headache    Past Surgical History:  Procedure Laterality Date  . ESOPHAGOGASTRODUODENOSCOPY (EGD) WITH PROPOFOL N/A 10/06/2015   Procedure: ESOPHAGOGASTRODUODENOSCOPY (EGD) WITH PROPOFOL;  Surgeon: Wallace Cullens, MD;  Location: [facility] ENDOSCOPY;  Service: Gastroenterology;  Laterality: N/A;  . HERNIA REPAIR    . pilonidial      Social History   Social History  . Marital status: Married    Spouse name: N/A  . 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 Smoking status: Former Games developer, quit in 2003   . Smokeless tobacco: Never Used  . Alcohol use No  . Drug use: No   Current Outpatient Prescriptions on File Prior to Visit  Medication Sig Dispense Refill  . cyclobenzaprine (FLEXERIL) 10 MG tablet Take 10 mg by mouth 3 (three) times daily as needed for muscle spasms.    Marland Kitchen lamoTRIgine (LAMICTAL) 100 MG tablet Take by mouth.    . levothyroxine (SYNTHROID, LEVOTHROID) 137 MCG tablet Take  1 tablet (137 mcg total) by mouth daily before breakfast. 45 tablet 1  . pantoprazole (PROTONIX) 40 MG tablet Take 40 mg by mouth daily.    . sertraline (ZOLOFT) 50 MG tablet 100 mg.      No current facility-administered medications on file prior to visit.    Allergies  Allergen Reactions  . Avelox [Moxifloxacin]    No family history on file.  PE: BP 110/62 (BP Location: Left Arm, Patient Position: Sitting)   Pulse 75   Wt 169 lb (76.7 kg)   SpO2 97%   BMI 24.96 kg/m  Wt Readings from Last 3 Encounters:  02/21/17 169 lb (76.7 kg)  10/21/16 159 lb (72.1 kg)  10/06/15 160 lb  (72.6 kg)   Constitutional: normal weight, in NAD Eyes: PERRLA, EOMI, no exophthalmos ENT: moist mucous membranes, no thyromegaly, no cervical lymphadenopathy Cardiovascular: RRR, No MRG Respiratory: CTA B Gastrointestinal: abdomen soft, NT, ND, BS+ Musculoskeletal: no deformities, strength intact in all 4 Skin: moist, warm, no rashes Neurological: no tremor with outstretched hands, DTR normal in all 4  ASSESSMENT: 1. Uncontrolled Hypothyroidism  PLAN:  1. Patient with long-standing hypothyroidism, possibly developed after spontaneous remission of Graves' disease. He is on levothyroxine DAW therapy, with fluctuating TFTs, with a TSH mostly suppressed in the past and quite high now. He also has a history of palpitations (with PACs), anxiety, hot flashes. He started a beta blocker (Inderal). - At last visit, we separated his Synthroid from the PPI and decreased the dose to 112 g daily >> however, TSH increased and we had to increase the dose to 137 g daily. - he continues on the above dose - latest thyroid labs reviewed with pt >> high TSH, after which we increased his levothyroxine dose - pt feels good on this dose. - we discussed about taking the thyroid hormone every day, with water, >30 minutes before breakfast, separated by >4 hours from acid reflux medications, calcium, iron, multivitamins. Pt. is taking it correctly. - will check thyroid tests today: TSH and fT4 - If labs are abnormal, he will need to return for repeat TFTs in 1.5 months - OTW, RTC in 6 mo  Component     Latest Ref Rng & Units 02/21/2017  T4,Free(Direct)     0.60 - 1.60 ng/dL 8.29  TSH     5.62 - 1.30 uIU/mL 9.76 (H)   TSH improved but still high >> will increase the LT4 dose to 150 mcg and recheck labs in 1.5 mo.  Carlus Pavlov, MD PhD [facility] Endocrinology

## 2017-02-22 MED ORDER — SYNTHROID 150 MCG PO TABS: 150.0000 ug | ORAL_TABLET | Freq: Every day | ORAL | 2 refills | Status: DC

## 2017-03-22 DIAGNOSIS — K210 Gastro-esophageal reflux disease with esophagitis: Secondary | ICD-10-CM | POA: Diagnosis not present

## 2017-03-22 DIAGNOSIS — E039 Hypothyroidism, unspecified: Secondary | ICD-10-CM | POA: Diagnosis not present

## 2017-04-11 ENCOUNTER — Other Ambulatory Visit (INDEPENDENT_AMBULATORY_CARE_PROVIDER_SITE_OTHER): Payer: 59

## 2017-04-11 DIAGNOSIS — E039 Hypothyroidism, unspecified: Secondary | ICD-10-CM

## 2017-04-11 LAB — T4, FREE: FREE T4: 1 ng/dL (ref 0.60–1.60)

## 2017-04-11 LAB — TSH: TSH: 2.59 u[IU]/mL (ref 0.35–4.50)

## 2017-04-12 ENCOUNTER — Encounter: Payer: Self-pay | Admitting: Internal Medicine

## 2017-06-30 ENCOUNTER — Other Ambulatory Visit: Payer: Self-pay | Admitting: Internal Medicine

## 2017-08-22 ENCOUNTER — Ambulatory Visit: Payer: 59 | Admitting: Internal Medicine

## 2017-08-22 DIAGNOSIS — Z0289 Encounter for other administrative examinations: Secondary | ICD-10-CM

## 2017-09-05 ENCOUNTER — Ambulatory Visit: Payer: 59 | Admitting: Internal Medicine

## 2017-09-05 ENCOUNTER — Encounter: Payer: Self-pay | Admitting: Internal Medicine

## 2017-09-05 VITALS — BP 128/80 | HR 73 | Ht 69.0 in | Wt 191.6 lb

## 2017-09-05 DIAGNOSIS — R635 Abnormal weight gain: Secondary | ICD-10-CM

## 2017-09-05 DIAGNOSIS — E039 Hypothyroidism, unspecified: Secondary | ICD-10-CM

## 2017-09-05 LAB — TSH: TSH: 3.01 u[IU]/mL (ref 0.35–4.50)

## 2017-09-05 LAB — T4, FREE: Free T4: 0.87 ng/dL (ref 0.60–1.60)

## 2017-09-05 NOTE — Patient Instructions (Signed)
Please stop at the lab.  Please continue Synthroid 150 mcg daily.  Take the thyroid hormone every day, with water, at least 30 minutes before breakfast, separated by at least 4 hours from: - acid reflux medications - calcium - iron - multivitamins  Please come back for a follow-up appointment in 6 months.   

## 2017-09-05 NOTE — Progress Notes (Signed)
Patient ID: Fonda KinderMichael J Strege, male   DOB: 12/19/1975, 42 y.o.   MRN: [id number]    HPI  Fonda KinderMichael J Zanella is a 42 y.o.-year-old male, initially referred by his PCP, Dr. Larwance SachsBabaoff, returning for f/u for uncontrolled hypothyroidism. Last visit 6 mo ago.  He gained weight (more than 20 pounds) after starting Paxil 4-6 weeks ago. Stopped Zoloft. He feels he gained the weight almost overnight. He feels his anxiety improved.   Reviewed and addended hx Pt. has been dx with hypothyroidism in 2003-05 (possibly 2/2 Graves ds.) >> Synthroid DAW 137 mcg alternating with 125 mcg daily in 05/2016.  We decrease LT4 to 112 mcg and separated it from Protonix, however, we had to increase the dose to 125 mcg daily in 11/2016, to 137 mcg daily in 12/2016, to 150 in 02/2017.  Pt is on Synthroid DAW 150 mcg daily, taken: - in am - fasting - at least 30 min from b'fast - no Ca, Fe, MVI - + PPIs later in the day, lunchtime and bedtime as needed - not on Biotin  Reviewed his thyroid tests: Lab Results  Component Value Date   TSH 2.59 04/11/2017   TSH 9.76 (H) 02/21/2017   TSH 13.29 (H) 01/17/2017   TSH 10.13 (H) 12/02/2016   TSH 6.86 (H) 10/21/2016   FREET4 1.00 04/11/2017   FREET4 1.00 02/21/2017   FREET4 0.87 01/17/2017   FREET4 0.74 12/02/2016   FREET4 0.93 10/21/2016     Pt denies: - feeling nodules in neck - hoarseness - dysphagia - choking - SOB with lying down  She has + FH of thyroid disorders in: M and daughter. No FH of thyroid cancer. No h/o radiation tx to head or neck.  No seaweed or kelp. No recent contrast studies. No herbal supplements. No Biotin use. No recent steroids use.   Pt. also has a history of PACs, palpitations, GAD.  He is on Propranolol prn for anxiety/palpitations.  He is eating a Mediterranean diet.  ROS: Constitutional: + weight gain/no weight loss, no fatigue, no subjective hyperthermia, no subjective hypothermia Eyes: no blurry vision, no  xerophthalmia ENT: no sore throat, + see HPI Cardiovascular: no CP/no SOB/no palpitations/no leg swelling Respiratory: no cough/no SOB/no wheezing Gastrointestinal: no N/no V/no D/no C/no acid reflux Musculoskeletal: no muscle aches/no joint aches Skin: no rashes, no hair loss Neurological: no tremors/no numbness/no tingling/no dizziness  I reviewed pt's medications, allergies, PMH, social hx, family hx, and changes were documented in the history of present illness. Otherwise, unchanged from my initial visit note. + melatonin at night.   Past Medical History:  Diagnosis Date  . Anemia   . Anxiety   . Asthma   . Chicken pox   . Costochondritis   . GERD (gastroesophageal reflux disease)   . Headache    Past Surgical History:  Procedure Laterality Date  . ESOPHAGOGASTRODUODENOSCOPY (EGD) WITH PROPOFOL N/A 10/06/2015   Procedure: ESOPHAGOGASTRODUODENOSCOPY (EGD) WITH PROPOFOL;  Surgeon: Wallace CullensPaul Y Oh, MD;  [facility] ENDOSCOPY;  Service: Gastroenterology;  Laterality: N/A;  . HERNIA REPAIR    . pilonidial      Social History   Social History  . Marital status: Married    Spouse name: N/A  . 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 Smoking status: Former Games developermoker, quit in 2003   . Smokeless tobacco: Never Used  . Alcohol use No  . Drug use: No  Current Outpatient Medications on File Prior to Visit  Medication Sig Dispense Refill  . cyclobenzaprine (FLEXERIL) 10 MG tablet Take 10 mg by mouth 3 (three) times daily as needed for muscle spasms.    Marland Kitchen lamoTRIgine (LAMICTAL) 100 MG tablet Take by mouth.    . pantoprazole (PROTONIX) 40 MG tablet Take 40 mg by mouth daily.    . propranolol (INDERAL) 20 MG tablet TAKE 2 TO 3 TABLETS BY MOUTH TWICE DAILY AS NEEDED FOR ANXIETY  1  . sertraline (ZOLOFT) 50 MG tablet 100 mg.     . SYNTHROID 150 MCG tablet TAKE 1 TABLET (150 MCG TOTAL) BY MOUTH DAILY BEFORE BREAKFAST. 45 tablet 1    No current facility-administered medications on file prior to visit.    Allergies  Allergen Reactions  . Avelox [Moxifloxacin]    No family history on file.  PE: BP 128/80   Pulse 73   Ht 5\' 9"  (1.753 m)   Wt 191 lb 9.6 oz (86.9 kg)   SpO2 97%   BMI 28.29 kg/m  Wt Readings from Last 3 Encounters:  09/05/17 191 lb 9.6 oz (86.9 kg)  02/21/17 169 lb (76.7 kg)  10/21/16 159 lb (72.1 kg)   Constitutional: normal weight, in NAD Eyes: PERRLA, EOMI, no exophthalmos ENT: moist mucous membranes, no thyromegaly, no cervical lymphadenopathy Cardiovascular: RRR, No MRG Respiratory: CTA B Gastrointestinal: abdomen soft, NT, ND, BS+ Musculoskeletal: no deformities, strength intact in all 4 Skin: moist, warm, no rashes Neurological: no tremor with outstretched hands, DTR normal in all 4  ASSESSMENT: 1. Aquired Hypothyroidism  2.  Weight gain  PLAN:  1. Patient with long-standing, uncontrolled, hypothyroidism, developed after spontaneous remission of Graves' disease.  He is on levothyroxine d.a.w. therapy, with fluctuating TFTs, with mostly suppressed TSH in the past, then elevated TSH, and finally normal TFTs at last check.  He has a history of palpitations (with PACs), anxiety, hot flashes.  He is taking a beta-blocker for palpitations. - latest thyroid labs reviewed with pt >> normal  03/2017 - he continues on LT4 150 mcg daily - pt feels good on this dose. - we discussed about taking the thyroid hormone every day, with water, >30 minutes before breakfast, separated by >4 hours from acid reflux medications, calcium, iron, multivitamins. Pt. is taking it correctly. - will check thyroid tests today: TSH and fT4 - If labs are abnormal, he will need to return for repeat TFTs in 1.5 months  2. Weight gain -Most likely due to Paxil.  He feels that this is working, so he will continue it for now. -Suggested cutting calories from his diet or increasing exercise to be able to  counteract  the significant weight gain from the medication  Needs a refill.  Office Visit on 09/05/2017  Component Date Value Ref Range Status  . TSH 09/05/2017 3.01  0.35 - 4.50 uIU/mL Final  . Free T4 09/05/2017 0.87  0.60 - 1.60 ng/dL Final   Comment: Specimens from patients who are undergoing biotin therapy and /or ingesting biotin supplements may contain high levels of biotin.  The higher biotin concentration in these specimens interferes with this Free T4 assay.  Specimens that contain high levels  of biotin may cause false high results for this Free T4 assay.  Please interpret results in light of the total clinical presentation of the patient.     The thyroid tests are normal.  Carlus Pavlov, MD PhD [facility] Endocrinology

## 2017-09-06 MED ORDER — SYNTHROID 150 MCG PO TABS: 150.0000 ug | ORAL_TABLET | Freq: Every day | ORAL | 3 refills | Status: DC

## 2017-09-20 DIAGNOSIS — Z0000 Encounter for general adult medical examination without abnormal findings: Secondary | ICD-10-CM | POA: Diagnosis not present

## 2017-09-20 DIAGNOSIS — Z13220 Encounter for screening for lipoid disorders: Secondary | ICD-10-CM | POA: Diagnosis not present

## 2017-09-20 DIAGNOSIS — R002 Palpitations: Secondary | ICD-10-CM | POA: Diagnosis not present

## 2017-09-28 DIAGNOSIS — K219 Gastro-esophageal reflux disease without esophagitis: Secondary | ICD-10-CM | POA: Diagnosis not present

## 2017-09-28 DIAGNOSIS — K2270 Barrett's esophagus without dysplasia: Secondary | ICD-10-CM | POA: Diagnosis not present

## 2017-10-10 ENCOUNTER — Encounter: Payer: Self-pay | Admitting: Internal Medicine

## 2018-03-05 DIAGNOSIS — Z23 Encounter for immunization: Secondary | ICD-10-CM | POA: Diagnosis not present

## 2018-03-05 DIAGNOSIS — T148XXA Other injury of unspecified body region, initial encounter: Secondary | ICD-10-CM | POA: Diagnosis not present

## 2018-03-07 ENCOUNTER — Other Ambulatory Visit: Payer: Self-pay

## 2018-03-07 ENCOUNTER — Ambulatory Visit: Payer: 59 | Admitting: Internal Medicine

## 2018-03-07 ENCOUNTER — Encounter: Payer: Self-pay | Admitting: Internal Medicine

## 2018-03-07 VITALS — BP 126/82 | HR 68 | Ht 69.0 in | Wt 197.0 lb

## 2018-03-07 DIAGNOSIS — E039 Hypothyroidism, unspecified: Secondary | ICD-10-CM

## 2018-03-07 LAB — TSH: TSH: 0.96 u[IU]/mL (ref 0.35–4.50)

## 2018-03-07 LAB — T4, FREE: Free T4: 0.91 ng/dL (ref 0.60–1.60)

## 2018-03-07 MED ORDER — LAMOTRIGINE 200 MG PO TABS: 200.0000 mg | ORAL_TABLET | Freq: Every day | ORAL | 1 refills | Status: DC

## 2018-03-07 NOTE — Progress Notes (Signed)
Patient ID: Micheal Watts, male   DOB: 1975/10/18, 42 y.o.   MRN: [id number]    HPI  Micheal Watts is a 42 y.o.-year-old male, initially referred by his PCP, Dr. Larwance Sachs, returning for f/u for uncontrolled hypothyroidism. Last visit 6 mo ago.  He is now on Paxil, Lamictal and now added Lithium. He continues to gain weight. He feels better on this regimen, though.  He initially gained more than 20 pounds after starting Paxil before last visit  Reviewed and addended history: Pt. has been dx with hypothyroidism in 2003-05 (possibly 2/2 Graves ds.) >> Synthroid DAW 137 mcg alternating with 125 mcg daily in 05/2016.  We decrease LT4 to 112 mcg and separated it from Protonix, however, we had to increase the dose to 125 mcg daily in 11/2016, to 137 mcg daily in 12/2016, to 150 in 02/2017.  Pt is on Synthroid DAW 150 mcg daily, taken: - in am - fasting - at least 30 min from b'fast - no Ca, Fe, MVI - + PPIs later in the day - not on Biotin  Reviewed TFTs: Lab Results  Component Value Date   TSH 3.01 09/05/2017   TSH 2.59 04/11/2017   TSH 9.76 (H) 02/21/2017   TSH 13.29 (H) 01/17/2017   TSH 10.13 (H) 12/02/2016   TSH 6.86 (H) 10/21/2016   FREET4 0.87 09/05/2017   FREET4 1.00 04/11/2017   FREET4 1.00 02/21/2017   FREET4 0.87 01/17/2017   FREET4 0.74 12/02/2016   FREET4 0.93 10/21/2016     Pt denies: - feeling nodules in neck - hoarseness - dysphagia - choking - SOB with lying down  She has + FH of thyroid disorders in: M and daughter. No FH of thyroid cancer. No h/o radiation tx to head or neck.  No recent contrast studies. No herbal supplements. No Biotin use. No recent steroids use.    Pt. also has a history of PACs, palpitations, GAD.  He is on propranolol as needed for anxiety/palpitations.  He very seldom has to take this.  He is eating a Mediterranean diet.  He started N-AC - for brain fog >> helped.  ROS: Constitutional: + Weight gain/no weight loss, +  fatigue, no subjective hyperthermia, + subjective hypothermia Eyes: no blurry vision, no xerophthalmia ENT: no sore throat, + see HPI Cardiovascular: no CP/no SOB/+ palpitations/no leg swelling Respiratory: no cough/no SOB/no wheezing Gastrointestinal: no N/no V/no D/no C/no acid reflux Musculoskeletal: + muscle aches/+ joint aches Skin: no rashes, no hair loss Neurological: no tremors/no numbness/no tingling/no dizziness  I reviewed pt's medications, allergies, PMH, social hx, family hx, and changes were documented in the history of present illness. Otherwise, unchanged from my initial visit note.   Past Medical History:  Diagnosis Date  . Anemia   . Anxiety   . Asthma   . Chicken pox   . Costochondritis   . GERD (gastroesophageal reflux disease)   . Headache    Past Surgical History:  Procedure Laterality Date  . ESOPHAGOGASTRODUODENOSCOPY (EGD) WITH PROPOFOL N/A 10/06/2015   Procedure: ESOPHAGOGASTRODUODENOSCOPY (EGD) WITH PROPOFOL;  Surgeon: Wallace Cullens, MD;  Location: [facility] ENDOSCOPY;  Service: Gastroenterology;  Laterality: N/A;  . HERNIA REPAIR    . pilonidial      Social History   Social History  . Marital status: Married    Spouse name: N/A  . 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 Smoking status: Former  Smoker, quit in 2003   . Smokeless tobacco: Never Used  . Alcohol use No  . Drug use: No   Current Outpatient Medications on File Prior to Visit  Medication Sig Dispense Refill  . cyclobenzaprine (FLEXERIL) 10 MG tablet Take 10 mg by mouth 3 (three) times daily as needed for muscle spasms.    Marland Kitchen lamoTRIgine (LAMICTAL) 100 MG tablet Take by mouth.    . pantoprazole (PROTONIX) 40 MG tablet Take 40 mg by mouth daily.    Marland Kitchen PARoxetine (PAXIL) 20 MG tablet Take 2 tablets by mouth daily.  1  . propranolol (INDERAL) 20 MG tablet TAKE 2 TO 3 TABLETS BY MOUTH TWICE DAILY AS NEEDED FOR ANXIETY  1  . SYNTHROID 150  MCG tablet Take 1 tablet (150 mcg total) by mouth daily before breakfast. 90 tablet 3   No current facility-administered medications on file prior to visit.    Allergies  Allergen Reactions  . Avelox [Moxifloxacin]    No family history on file.  PE: BP 126/82   Pulse 68   Ht 5\' 9"  (1.753 m) Comment: measured  Wt 197 lb (89.4 kg)   SpO2 94%   BMI 29.09 kg/m  Wt Readings from Last 3 Encounters:  03/07/18 197 lb (89.4 kg)  09/05/17 191 lb 9.6 oz (86.9 kg)  02/21/17 169 lb (76.7 kg)   Constitutional: overweight, in NAD Eyes: PERRLA, EOMI, no exophthalmos ENT: moist mucous membranes, no thyromegaly, no cervical lymphadenopathy Cardiovascular: RRR, No MRG Respiratory: CTA B Gastrointestinal: abdomen soft, NT, ND, BS+ Musculoskeletal: no deformities, strength intact in all 4 Skin: moist, warm, no rashes Neurological: no tremor with outstretched hands, DTR normal in all 4  ASSESSMENT: 1. Aquired Hypothyroidism  PLAN:  1. Patient with long-standing, previously uncontrolled, hypothyroidism, developed after spontaneous remission of Graves' disease.  He is on Synthroid, with previously fluctuating TFTs but mostly suppressed TSH in the past.  He has a long history of palpitations with PACs, anxiety, hot flashes.  He is on a as needed beta-blocker for palpitations.  Did not have to take this recently.  Since last visit, he started lithium.  This was added to Paxil.  He gained 6 pounds since last visit, but previously gained 20 pounds after starting Paxil.  He also has some mental fog, which is helped by the addition of NAC.  We discussed that all of this can also be signs of uncontrolled hypothyroidism, but in his case, they are most likely related to his psychotropic medications. - latest thyroid labs reviewed with pt >> normal 6 months ago - he continues on LT4 150 mcg daily - pt feels good on this dose. - we discussed about taking the thyroid hormone every day, with water, >30 minutes  before breakfast, separated by >4 hours from acid reflux medications, calcium, iron, multivitamins. Pt. is taking it correctly. - will check thyroid tests today: TSH and fT4 - If labs are abnormal, he will need to return for repeat TFTs in 1.5 months - He refuses a flu shot today. -I will see him back in 1 year  - time spent with the patient: 15 min, of which >50% was spent in obtaining information about his symptoms, reviewing his previous labs, evaluations, and treatments, counseling him about his condition (please see the discussed topics above), and developing a plan to further investigate and treat it; he had a number of questions which I addressed.  Needs a refill - 1 mo at a time  Component  Latest Ref Rng & Units 03/07/2018  T4,Free(Direct)     0.60 - 1.60 ng/dL 1.61  TSH     0.96 - 0.45 uIU/mL 0.96  TFTs are normal.  Carlus Pavlov, MD PhD [facility] Endocrinology

## 2018-03-07 NOTE — Patient Instructions (Signed)
Please stop at the lab.  Please continue Synthroid 150 mcg daily.  Take the thyroid hormone every day, with water, at least 30 minutes before breakfast, separated by at least 4 hours from: - acid reflux medications - calcium - iron - multivitamins  Please come back for a follow-up appointment in 1 year. 

## 2018-03-08 MED ORDER — SYNTHROID 150 MCG PO TABS: 150.0000 ug | ORAL_TABLET | Freq: Every day | ORAL | 11 refills | Status: DC

## 2018-03-23 DIAGNOSIS — E7800 Pure hypercholesterolemia, unspecified: Secondary | ICD-10-CM | POA: Diagnosis not present

## 2018-03-23 DIAGNOSIS — M2240 Chondromalacia patellae, unspecified knee: Secondary | ICD-10-CM | POA: Diagnosis not present

## 2018-03-23 DIAGNOSIS — M545 Low back pain: Secondary | ICD-10-CM | POA: Diagnosis not present

## 2018-04-10 ENCOUNTER — Other Ambulatory Visit: Payer: Self-pay | Admitting: Psychiatry

## 2018-04-12 ENCOUNTER — Encounter: Payer: Self-pay | Admitting: Emergency Medicine

## 2018-04-12 DIAGNOSIS — F4001 Agoraphobia with panic disorder: Secondary | ICD-10-CM | POA: Insufficient documentation

## 2018-04-12 DIAGNOSIS — F3181 Bipolar II disorder: Secondary | ICD-10-CM | POA: Insufficient documentation

## 2018-04-12 DIAGNOSIS — F411 Generalized anxiety disorder: Secondary | ICD-10-CM

## 2018-04-28 ENCOUNTER — Ambulatory Visit: Payer: Self-pay | Admitting: Psychiatry

## 2018-05-05 ENCOUNTER — Encounter: Payer: Self-pay | Admitting: Psychiatry

## 2018-05-05 ENCOUNTER — Ambulatory Visit: Payer: 59 | Admitting: Psychiatry

## 2018-05-05 DIAGNOSIS — F4001 Agoraphobia with panic disorder: Secondary | ICD-10-CM

## 2018-05-05 DIAGNOSIS — F411 Generalized anxiety disorder: Secondary | ICD-10-CM | POA: Diagnosis not present

## 2018-05-05 NOTE — Progress Notes (Addendum)
Micheal KinderMichael J Watts [id number] 04/13/1976 42 y.o.  Subjective:   Patient ID:  Micheal Watts is a 42 y.o. (DOB 05/09/1976) male.  Chief Complaint:  Chief Complaint  Patient presents with  . Follow-up    Medication Management    HPI Micheal KinderMichael J Foley presents to the office today for follow-up of mood.  Overall about 8/10 in mood.  Lithium seemed more helpful in the beginning but still struggles some with anxiety, irritability, and depression mixed together.  Less than 1/2 the time.  Sleep ok.  1 panic since here without pcpt. About 5 hr sleep and not drowsy daytime.  Sometimes limited by schedule.   Review of Systems:  Review of Systems  Neurological: Negative for tremors and weakness.  Psychiatric/Behavioral: Positive for dysphoric mood and self-injury. Negative for agitation, behavioral problems, confusion, decreased concentration, hallucinations, sleep disturbance and suicidal ideas. The patient is nervous/anxious. The patient is not hyperactive.     Medications: I have reviewed the patient's current medications.  Current Outpatient Medications  Medication Sig Dispense Refill  . cyclobenzaprine (FLEXERIL) 10 MG tablet Take 10 mg by mouth 3 (three) times daily as needed for muscle spasms.    Marland Kitchen. lamoTRIgine (LAMICTAL) 200 MG tablet Take 1 tablet (200 mg total) by mouth daily. 90 tablet 1  . lithium carbonate 300 MG capsule TAKE 3 CAPSULES BY MOUTH AT BEDTIME 90 capsule 1  . pantoprazole (PROTONIX) 40 MG tablet Take 40 mg by mouth 2 (two) times daily.     Marland Kitchen. PARoxetine (PAXIL) 30 MG tablet Take 30 mg by mouth daily.    . propranolol (INDERAL) 20 MG tablet TAKE 2 TO 3 TABLETS BY MOUTH TWICE DAILY AS NEEDED FOR ANXIETY  1  . SYNTHROID 150 MCG tablet Take 1 tablet (150 mcg total) by mouth daily before breakfast. 30 tablet 11   No current facility-administered medications for this visit.     Medication Side Effects: Other: a little foggy  Allergies:  Allergies  Allergen  Reactions  . Avelox [Moxifloxacin]     Past Medical History:  Diagnosis Date  . Anemia   . Anxiety   . Asthma   . Chicken pox   . Costochondritis   . GERD (gastroesophageal reflux disease)   . Headache     History reviewed. No pertinent family history.  Social History   Socioeconomic History  . Marital status: Married    Spouse name: Not on file  . Number of children: Not on file  . Years of education: Not on file  . Highest education level: Not on file  Occupational History  . Not on file  Social Needs  . Financial resource strain: Not on file  . Food insecurity:    Worry: Not on file    Inability: Not on file  . Transportation needs:    Medical: Not on file    Non-medical: Not on file  Tobacco Use  . Smoking status: Former Games developermoker  . Smokeless tobacco: Never Used  Substance and Sexual Activity  . Alcohol use: No  . Drug use: No  . Sexual activity: Not on file  Lifestyle  . Physical activity:    Days per week: Not on file    Minutes per session: Not on file  . Stress: Not on file  Relationships  . Social connections:    Talks on phone: Not on file    Gets together: Not on file    Attends religious service: Not on file    Active  member of club or organization: Not on file    Attends meetings of clubs or organizations: Not on file    Relationship status: Not on file  . Intimate partner violence:    Fear of current or ex partner: Not on file    Emotionally abused: Not on file    Physically abused: Not on file    Forced sexual activity: Not on file  Other Topics Concern  . Not on file  Social History Narrative  . Not on file    Past Medical History, Surgical history, Social history, and Family history were reviewed and updated as appropriate.   Please see review of systems for further details on the patient's review from today.   Objective:   Physical Exam:  There were no vitals taken for this visit.  Physical Exam Constitutional:      General:  He is not in acute distress.    Appearance: Normal appearance. He is well-developed.  Musculoskeletal:        General: No deformity.  Neurological:     Mental Status: He is alert and oriented to person, place, and time.     Motor: No tremor.     Coordination: Coordination normal.     Gait: Gait normal.  Psychiatric:        Attention and Perception: Attention normal.        Mood and Affect: Mood is anxious and depressed. Affect is not labile, blunt, angry or inappropriate.        Speech: Speech normal.        Behavior: Behavior normal.        Thought Content: Thought content normal. Thought content does not include homicidal or suicidal ideation. Thought content does not include homicidal or suicidal plan.        Cognition and Memory: Cognition normal.        Judgment: Judgment normal.     Comments: Insight intact. No auditory or visual hallucinations. No delusions.      Lab Review:  No results found for: NA, K, CL, CO2, GLUCOSE, BUN, CREATININE, CALCIUM, PROT, ALBUMIN, AST, ALT, ALKPHOS, BILITOT, GFRNONAA, GFRAA  No results found for: WBC, RBC, HGB, HCT, PLT, MCV, MCH, MCHC, RDW, LYMPHSABS, MONOABS, EOSABS, BASOSABS  No results found for: POCLITH, LITHIUM   No results found for: PHENYTOIN, PHENOBARB, VALPROATE, CBMZ   .res Assessment: Plan:    Panic disorder with agoraphobia  Generalized anxiety disorder RO bipolar 2  Benefit from lithium for all mood sx.  Try to get more sleep if possible.  Lithuium level done but not received.  Consider increase after getting the level for the residual sx. Counseled patient regarding potential benefits, risks, and side effects of lithium to include potential risk of lithium affecting thyroid and renal function.  Discussed need for periodic lab monitoring to determine drug level and to assess for potential adverse effects.  Counseled patient regarding signs and symptoms of lithium toxicity and advised that they notify office immediately or  seek urgent medical attention if experiencing these signs and symptoms.  Patient advised to contact office with any questions or concerns.  FU 3 mos  Meredith Staggers, MD, DFAPA  Please see After Visit Summary for patient specific instructions.  Future Appointments  Date Time Provider Department Center  03/08/2019  4:15 PM Carlus Pavlov, MD [facility] None    No orders of the defined types were placed in this encounter. 05/07/18 Lithium level received = 0.7.  Therefore room to increase  The  dosage to 1200 mg/D.  Will have him call if there are side effects. Meredith Staggers, MD, DFAPA    -------------------------------

## 2018-05-08 NOTE — Progress Notes (Signed)
Tried to reach pt but no answer and voicemail not set up

## 2018-05-10 NOTE — Progress Notes (Signed)
Tried to reach pt no voicemail set up to leave message.

## 2018-05-25 NOTE — Progress Notes (Signed)
Pt given instructions to increase lithium to 1200mg /day. Verbalized understanding.

## 2018-06-03 DIAGNOSIS — R05 Cough: Secondary | ICD-10-CM | POA: Diagnosis not present

## 2018-06-03 DIAGNOSIS — R509 Fever, unspecified: Secondary | ICD-10-CM | POA: Diagnosis not present

## 2018-06-13 DIAGNOSIS — J209 Acute bronchitis, unspecified: Secondary | ICD-10-CM | POA: Diagnosis not present

## 2018-06-16 ENCOUNTER — Other Ambulatory Visit: Payer: Self-pay | Admitting: Psychiatry

## 2018-07-25 DIAGNOSIS — M6740 Ganglion, unspecified site: Secondary | ICD-10-CM | POA: Diagnosis not present

## 2018-07-25 DIAGNOSIS — N486 Induration penis plastica: Secondary | ICD-10-CM | POA: Diagnosis not present

## 2018-08-04 ENCOUNTER — Ambulatory Visit: Payer: 59 | Admitting: Psychiatry

## 2018-08-12 ENCOUNTER — Other Ambulatory Visit: Payer: Self-pay | Admitting: Psychiatry

## 2018-08-25 ENCOUNTER — Ambulatory Visit (INDEPENDENT_AMBULATORY_CARE_PROVIDER_SITE_OTHER): Payer: 59 | Admitting: Psychiatry

## 2018-08-25 ENCOUNTER — Other Ambulatory Visit: Payer: Self-pay | Admitting: Psychiatry

## 2018-08-25 ENCOUNTER — Other Ambulatory Visit: Payer: Self-pay

## 2018-08-25 ENCOUNTER — Encounter: Payer: Self-pay | Admitting: Psychiatry

## 2018-08-25 DIAGNOSIS — F411 Generalized anxiety disorder: Secondary | ICD-10-CM

## 2018-08-25 DIAGNOSIS — F4001 Agoraphobia with panic disorder: Secondary | ICD-10-CM | POA: Diagnosis not present

## 2018-08-25 NOTE — Progress Notes (Signed)
Micheal Watts [id number] [birth year] 43 y.o.  Subjective:   Patient ID:  Micheal Watts is a 43 y.o. (DOB [birth year]) male.  Chief Complaint:  Chief Complaint  Patient presents with  . Follow-up    med management    HPI Micheal Watts presents to the office today for follow-up of mood and anxiety.  Off lamotrigine at least 2 mos.  More irritable lately.  Increasing lithiumm did not help.  A little more fuzzy headed.  Wonders if stress is problem.  More fussing with wife but not sure which is cause.  Not irritable outside the home.  Had a little more anxiety without panic.  Manageable with prppranolol.  Increased sleep need somewhat.  Dx short sleeper in college 5-6 hours normal.   Caffeine 4 cups daily. Unchanged.  Past Psychiatric Medication Trials: Paroxetine 40, lamotrigine 200, lithium 1200, propranolol as needed, sertraline 200, nortriptyline 100 side effects, Trintellix, Vraylar, venlafaxine side effects, will Lexapro no response at low dose   Review of Systems:  Review of Systems  Constitutional: Negative for fatigue.  Neurological: Negative for tremors and weakness.    Medications: I have reviewed the patient's current medications.  Current Outpatient Medications  Medication Sig Dispense Refill  . cyclobenzaprine (FLEXERIL) 10 MG tablet Take 10 mg by mouth 3 (three) times daily as needed for muscle spasms.    Marland Kitchen lithium carbonate 300 MG capsule Take 4 capsules (1,200 mg total) by mouth at bedtime. 120 capsule 1  . pantoprazole (PROTONIX) 40 MG tablet Take 40 mg by mouth 2 (two) times daily.     Marland Kitchen PARoxetine (PAXIL) 20 MG tablet TAKE 1 & 1/2 TABLETS BY MOUTH EVERY DAY (Patient taking differently: 20 mg. ) 135 tablet 0  . propranolol (INDERAL) 20 MG tablet TAKE 2 TO 3 TABLETS BY MOUTH TWICE DAILY AS NEEDED FOR ANXIETY  1  . SYNTHROID 150 MCG tablet Take 1 tablet (150 mcg total) by mouth daily before breakfast. 30 tablet 11  . lamoTRIgine (LAMICTAL) 200 MG  tablet Take 1 tablet (200 mg total) by mouth daily. (Patient not taking: Reported on 08/25/2018) 90 tablet 1   No current facility-administered medications for this visit.     Medication Side Effects: Fatigue  Allergies:  Allergies  Allergen Reactions  . Avelox [Moxifloxacin]     Past Medical History:  Diagnosis Date  . Anemia   . Anxiety   . Asthma   . Chicken pox   . Costochondritis   . GERD (gastroesophageal reflux disease)   . Headache     History reviewed. No pertinent family history.  Social History   Socioeconomic History  . Marital status: Married    Spouse name: Not on file  . Number of children: Not on file  . Years of education: Not on file  . Highest education level: Not on file  Occupational History  . Not on file  Social Needs  . Financial resource strain: Not on file  . Food insecurity:    Worry: Not on file    Inability: Not on file  . Transportation needs:    Medical: Not on file    Non-medical: Not on file  Tobacco Use  . Smoking status: Former Games developer  . Smokeless tobacco: Never Used  Substance and Sexual Activity  . Alcohol use: No  . Drug use: No  . Sexual activity: Not on file  Lifestyle  . Physical activity:    Days per week: Not on file  Minutes per session: Not on file  . Stress: Not on file  Relationships  . Social connections:    Talks on phone: Not on file    Gets together: Not on file    Attends religious service: Not on file    Active member of club or organization: Not on file    Attends meetings of clubs or organizations: Not on file    Relationship status: Not on file  . Intimate partner violence:    Fear of current or ex partner: Not on file    Emotionally abused: Not on file    Physically abused: Not on file    Forced sexual activity: Not on file  Other Topics Concern  . Not on file  Social History Narrative  . Not on file    Past Medical History, Surgical history, Social history, and Family history were  reviewed and updated as appropriate.   Please see review of systems for further details on the patient's review from today.   Objective:   Physical Exam:  There were no vitals taken for this visit.  Physical Exam Neurological:     Mental Status: He is alert and oriented to person, place, and time.     Cranial Nerves: No dysarthria.  Psychiatric:        Attention and Perception: Attention normal.        Mood and Affect: Mood is anxious.        Speech: Speech normal.        Behavior: Behavior is cooperative.        Thought Content: Thought content normal. Thought content is not paranoid or delusional. Thought content does not include homicidal or suicidal ideation. Thought content does not include homicidal or suicidal plan.        Cognition and Memory: Cognition and memory normal.        Judgment: Judgment normal.     Comments: Irritability at home.  He has some significant residual anxiety.  The lithium has helped his anxiety.     Lab Review:  No results found for: NA, K, CL, CO2, GLUCOSE, BUN, CREATININE, CALCIUM, PROT, ALBUMIN, AST, ALT, ALKPHOS, BILITOT, GFRNONAA, GFRAA  No results found for: WBC, RBC, HGB, HCT, PLT, MCV, MCH, MCHC, RDW, LYMPHSABS, MONOABS, EOSABS, BASOSABS  No results found for: POCLITH, LITHIUM   No results found for: PHENYTOIN, PHENOBARB, VALPROATE, CBMZ   .res Assessment: Plan:    Panic disorder with agoraphobia  Generalized anxiety disorder  Rule out bipolar 2 or other mood disorder.  His mood is not changed remarkably off the lamotrigine.  I believe he discontinued that by accident or misunderstanding.  He is not more depressed.  He is actually a little more irritable since the increase in lithium and those to her unlikely to be related.  But he did not gain any specific benefit with the increase of lithium from 900 to 1200 mg daily.  We discussed the irritability in detail.  It really only happens at home with his wife.  This makes it much more  likely to be a relational problem than a mood disorder.  His wife blames it on a mood disorder or on his psychiatric condition however if that were the case it would happen in other contacts and not just at home.  Discussed the possibility of counseling for he and his wife to work on things.  Discussed the possibility of other medication trials for irritability but as noted I do not advise that at  this time.  Decrease lithium to 900 mg daily.  Because of no additional benefit at 1200 mg daily.  Disc caffeine and irritability.  Counseled patient regarding potential benefits, risks, and side effects of lithium to include potential risk of lithium affecting thyroid and renal function.  Discussed need for periodic lab monitoring to determine drug level and to assess for potential adverse effects.  Counseled patient regarding signs and symptoms of lithium toxicity and advised that they notify office immediately or seek urgent medical attention if experiencing these signs and symptoms.  Patient advised to contact office with any questions or concerns. His level at 900 mg daily was 0.7 in December.  This was a 30-minute appointment  I connected with patient by a video enabled telemedicine application or telephone, with their informed consent, and verified patient privacy and that I am speaking with the correct person using two identifiers.  I was located at office and patient at home.  Meredith Staggers MD, DFAPA  Please see After Visit Summary for patient specific instructions.  Future Appointments  Date Time Provider Department Center  03/08/2019  4:15 PM Carlus Pavlov, MD [facility] None    No orders of the defined types were placed in this encounter.     -------------------------------

## 2018-09-25 DIAGNOSIS — E7800 Pure hypercholesterolemia, unspecified: Secondary | ICD-10-CM | POA: Diagnosis not present

## 2018-09-25 DIAGNOSIS — M545 Low back pain: Secondary | ICD-10-CM | POA: Diagnosis not present

## 2018-09-25 DIAGNOSIS — N486 Induration penis plastica: Secondary | ICD-10-CM | POA: Diagnosis not present

## 2018-10-03 DIAGNOSIS — N4340 Spermatocele of epididymis, unspecified: Secondary | ICD-10-CM | POA: Diagnosis not present

## 2018-10-03 DIAGNOSIS — N486 Induration penis plastica: Secondary | ICD-10-CM | POA: Diagnosis not present

## 2018-10-06 DIAGNOSIS — E7800 Pure hypercholesterolemia, unspecified: Secondary | ICD-10-CM | POA: Diagnosis not present

## 2018-11-05 ENCOUNTER — Other Ambulatory Visit: Payer: Self-pay | Admitting: Psychiatry

## 2018-11-07 DIAGNOSIS — R946 Abnormal results of thyroid function studies: Secondary | ICD-10-CM | POA: Diagnosis not present

## 2018-12-27 ENCOUNTER — Other Ambulatory Visit: Payer: Self-pay

## 2018-12-27 MED ORDER — ALPRAZOLAM 1 MG PO TABS: ORAL_TABLET | ORAL | 0 refills | Status: DC

## 2019-01-09 DIAGNOSIS — K529 Noninfective gastroenteritis and colitis, unspecified: Secondary | ICD-10-CM | POA: Diagnosis not present

## 2019-02-01 ENCOUNTER — Ambulatory Visit: Payer: 59 | Admitting: Psychiatry

## 2019-03-08 ENCOUNTER — Ambulatory Visit: Payer: 59 | Admitting: Internal Medicine

## 2019-03-16 ENCOUNTER — Other Ambulatory Visit: Payer: Self-pay | Admitting: Psychiatry

## 2019-03-26 ENCOUNTER — Other Ambulatory Visit: Payer: Self-pay | Admitting: Psychiatry

## 2019-03-30 DIAGNOSIS — E7800 Pure hypercholesterolemia, unspecified: Secondary | ICD-10-CM | POA: Diagnosis not present

## 2019-03-30 DIAGNOSIS — M545 Low back pain: Secondary | ICD-10-CM | POA: Diagnosis not present

## 2019-03-30 DIAGNOSIS — N486 Induration penis plastica: Secondary | ICD-10-CM | POA: Diagnosis not present

## 2019-03-30 DIAGNOSIS — R6889 Other general symptoms and signs: Secondary | ICD-10-CM | POA: Diagnosis not present

## 2019-04-03 ENCOUNTER — Other Ambulatory Visit: Payer: Self-pay | Admitting: Internal Medicine

## 2019-04-04 ENCOUNTER — Telehealth: Payer: Self-pay | Admitting: Internal Medicine

## 2019-04-04 NOTE — Telephone Encounter (Signed)
MEDICATION: SYNTHROID 150 MCG tablet  PHARMACY:   [id number] - [place], [place] - [address] [place] [phone] (Phone) [phone] (Fax)    IS THIS A 90 DAY SUPPLY : No-30 day supply preferred  IS PATIENT OUT OF MEDICATION: No  IF NOT; HOW MUCH IS LEFT: 5 tablets  LAST APPOINTMENT DATE: @11 /02/2019  NEXT APPOINTMENT DATE:@12 /18/2020  DO WE HAVE YOUR PERMISSION TO LEAVE A DETAILED MESSAGE: Yes but only ph# [phone]  OTHER COMMENTS:    **Let patient know to contact pharmacy at the end of the day to make sure medication is ready. **  ** Please notify patient to allow 48-72 hours to process**  **Encourage patient to contact the pharmacy for refills or they can request refills through [facility]**

## 2019-04-05 MED ORDER — SYNTHROID 150 MCG PO TABS: 150.0000 ug | ORAL_TABLET | Freq: Every day | ORAL | 11 refills | Status: DC

## 2019-04-05 NOTE — Telephone Encounter (Signed)
RX sent

## 2019-05-08 DIAGNOSIS — F4320 Adjustment disorder, unspecified: Secondary | ICD-10-CM | POA: Diagnosis not present

## 2019-05-11 ENCOUNTER — Ambulatory Visit: Payer: Self-pay | Admitting: Internal Medicine

## 2019-05-13 ENCOUNTER — Other Ambulatory Visit: Payer: Self-pay | Admitting: Psychiatry

## 2019-05-15 NOTE — Telephone Encounter (Signed)
Doesn't look like he uses too often ok?

## 2019-05-21 ENCOUNTER — Telehealth: Payer: Self-pay | Admitting: Psychiatry

## 2019-05-21 NOTE — Telephone Encounter (Signed)
Restart at full dose ASAP.  If we need to send to a different pharmacy we can.  Not a good idea to run out of paxil.

## 2019-05-21 NOTE — Telephone Encounter (Signed)
Micheal Watts called to make an appt which is 05/31/18.  He also reported that because the pharmacy didn't have the paxil on hand he hasn't taken it in a week.  He is able to pick it up tomorrow.  Should he start at a lower dose and build back up since he has been off it so long?  Or just start back at regular dose?  Please call to advice.

## 2019-05-21 NOTE — Telephone Encounter (Signed)
Pt. Made aware.

## 2019-05-22 DIAGNOSIS — F4320 Adjustment disorder, unspecified: Secondary | ICD-10-CM | POA: Diagnosis not present

## 2019-05-29 ENCOUNTER — Ambulatory Visit: Payer: BC Managed Care – PPO | Admitting: Internal Medicine

## 2019-05-29 ENCOUNTER — Other Ambulatory Visit: Payer: Self-pay

## 2019-05-29 ENCOUNTER — Encounter: Payer: Self-pay | Admitting: Internal Medicine

## 2019-05-29 VITALS — BP 130/90 | HR 80 | Ht 69.0 in | Wt 206.0 lb

## 2019-05-29 DIAGNOSIS — E039 Hypothyroidism, unspecified: Secondary | ICD-10-CM

## 2019-05-29 NOTE — Progress Notes (Signed)
Patient ID: Micheal Watts, male   DOB: July 03, 1975, 44 y.o.   MRN: [id number]   This visit occurred during the SARS-CoV-2 public health emergency.  Safety protocols were in place, including screening questions prior to the visit, additional usage of staff PPE, and extensive cleaning of exam room while observing appropriate contact time as indicated for disinfecting solutions.   HPI  Micheal Watts is a 44 y.o.-year-old male, initially referred by his PCP, Dr. Larwance Sachs, returning for f/u for acquired hypothyroidism. Last visit 1 year and 2 months ago.  Patient is on Paxil, Lamictal, and added lithium before last visit.  Unfortunately, he continues to gain weight, gained 10 pounds since last visit.  He does feel better on the above regimen, though. He started on a diet this week.  Since last visit, he changed his job and also separated from his wife.  Reviewed and addended history: Pt. has been dx with hypothyroidism in 2003-05 (possibly 2/2 Graves ds.) >> Synthroid DAW 137 mcg alternating with 125 mcg daily in 05/2016.  We decrease LT4 to 112 mcg and separated it from Protonix, however, we had to increase the dose to 125 mcg daily in 11/2016, to 137 mcg daily in 12/2016, and to 115 02/2018.  He is on Synthroid d.a.w. 150 mcg daily, taken: - in am  - fasting - at least 30 min from b'fast - no Ca, Fe, MVI, but is on PPIs later in the day  - not on Biotin  Reviewed his TFTs: Lab Results  Component Value Date   TSH 0.96 03/07/2018   TSH 3.01 09/05/2017   TSH 2.59 04/11/2017   TSH 9.76 (H) 02/21/2017   TSH 13.29 (H) 01/17/2017   TSH 10.13 (H) 12/02/2016   TSH 6.86 (H) 10/21/2016   FREET4 0.91 03/07/2018   FREET4 0.87 09/05/2017   FREET4 1.00 04/11/2017   FREET4 1.00 02/21/2017   FREET4 0.87 01/17/2017   FREET4 0.74 12/02/2016   FREET4 0.93 10/21/2016     Pt denies: - feeling nodules in neck - hoarseness - dysphagia - choking - SOB with lying down  She has + FH of  thyroid disorders in: M and daughter. No FH of thyroid cancer. No h/o radiation tx to head or neck.  No herbal supplements. No Biotin use. No recent steroids use.   Pt. also has a history of PACs, palpitations, GAD.  He is on propranolol as needed for anxiety/palpitations.  He very seldom has to take this.  He started N-AC - for brain fog before last visit and this helps. Now off but now much better on his vegetarian diet.  ROS: Constitutional: + weight gain/no weight loss, no fatigue, no subjective hyperthermia, no subjective hypothermia Eyes: no blurry vision, no xerophthalmia ENT: no sore throat, + see HPI Cardiovascular: no CP/no SOB/no palpitations/no leg swelling Respiratory: no cough/no SOB/no wheezing Gastrointestinal: no N/no V/no D/no C/no acid reflux Musculoskeletal: no muscle aches/no joint aches Skin: no rashes, no hair loss Neurological: no tremors/no numbness/no tingling/no dizziness  I reviewed pt's medications, allergies, PMH, social hx, family hx, and changes were documented in the history of present illness. Otherwise, unchanged from my initial visit note.   Past Medical History:  Diagnosis Date  . Anemia   . Anxiety   . Asthma   . Chicken pox   . Costochondritis   . GERD (gastroesophageal reflux disease)   . Headache    Past Surgical History:  Procedure Laterality Date  . ESOPHAGOGASTRODUODENOSCOPY (EGD) WITH PROPOFOL  N/A 10/06/2015   Procedure: ESOPHAGOGASTRODUODENOSCOPY (EGD) WITH PROPOFOL;  Surgeon: Hulen Luster, MD;  Location: [facility] ENDOSCOPY;  Service: Gastroenterology;  Laterality: N/A;  . HERNIA REPAIR    . pilonidial      Social History   Social History  . Marital status: Married    Spouse name: N/A  . 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 Smoking status: Former Research scientist (life sciences), quit in 2003   . Smokeless tobacco: Never Used  . Alcohol use No  . Drug use: No   Current Outpatient Medications  on File Prior to Visit  Medication Sig Dispense Refill  . ALPRAZolam (XANAX) 1 MG tablet TAKE 1 TABLET BY MOUTH DAILY AS NEEDED FOR PANIC 20 tablet 0  . cyclobenzaprine (FLEXERIL) 10 MG tablet Take 10 mg by mouth 3 (three) times daily as needed for muscle spasms.    Marland Kitchen lamoTRIgine (LAMICTAL) 200 MG tablet Take 1 tablet (200 mg total) by mouth daily. (Patient not taking: Reported on 08/25/2018) 90 tablet 1  . lithium carbonate 300 MG capsule Take 3 capsules (900 mg total) by mouth at bedtime. 90 capsule 5  . pantoprazole (PROTONIX) 40 MG tablet Take 40 mg by mouth 2 (two) times daily.     Marland Kitchen PARoxetine (PAXIL) 20 MG tablet TAKE 1 AND 1/2 TABLETS BY MOUTH DAILY 45 tablet 2  . propranolol (INDERAL) 20 MG tablet TAKE 2 TO 3 TABLETS BY MOUTH TWICE DAILY AS NEEDED FOR ANXIETY  1  . SYNTHROID 150 MCG tablet Take 1 tablet (150 mcg total) by mouth daily before breakfast. 30 tablet 11   No current facility-administered medications on file prior to visit.   Allergies  Allergen Reactions  . Avelox [Moxifloxacin]    No family history on file.  PE: BP 130/90   Pulse 80   Ht 5\' 9"  (1.753 m)   Wt 206 lb (93.4 kg)   SpO2 95%   BMI 30.42 kg/m  Wt Readings from Last 3 Encounters:  05/29/19 206 lb (93.4 kg)  03/07/18 197 lb (89.4 kg)  09/05/17 191 lb 9.6 oz (86.9 kg)   Constitutional: overweight, in NAD Eyes: PERRLA, EOMI, no exophthalmos ENT: moist mucous membranes, no thyromegaly, no cervical lymphadenopathy Cardiovascular: RRR, No MRG Respiratory: CTA B Gastrointestinal: abdomen soft, NT, ND, BS+ Musculoskeletal: no deformities, strength intact in all 4 Skin: moist, warm, no rashes Neurological: no tremor with outstretched hands, DTR normal in all 4  ASSESSMENT: 1. Aquired Hypothyroidism  PLAN:  1. Patient with longstanding, previously uncontrolled hypothyroidism, developed after spontaneous remission of Graves' disease.  He is on Synthroid d.a.w., with previously fluctuating TFTs but mostly  suppressed TSH in the past.  He has a long history of palpitations with PACs, anxiety, hot flashes and is on a prn beta-blocker.  However, he did not have to take this since last visit.  Before last visit, he started lithium, added to Paxil.  He initially gained a significant amount of weight (20 pounds) after starting Paxil (approximately 20 pounds before last visit and then more since last visit.  We discussed that with increased in the weight, he may need a higher dose of Synthroid.  However, he just started on a vegetarian diet and he feels much better and would like to continue with this.  His weight may change after this. - latest thyroid labs reviewed with pt >> normal: Lab Results  Component Value Date   TSH  0.96 03/07/2018   - he continues on Synthroid d.a.w. 150 mcg daily - pt feels good on this dose. - we discussed about taking the thyroid hormone every day, with water, >30 minutes before breakfast, separated by >4 hours from acid reflux medications, calcium, iron, multivitamins. Pt. is taking it correctly. - will check thyroid tests today: TSH and fT4 - If labs are abnormal, he will need to return for repeat TFTs in 1.5 months -I will see him back in 1 year  Office Visit on 05/29/2019  Component Date Value Ref Range Status  . TSH 05/29/2019 2.53  0.35 - 4.50 uIU/mL Final  . Free T4 05/29/2019 1.12  0.60 - 1.60 ng/dL Final   Comment: Specimens from patients who are undergoing biotin therapy and /or ingesting biotin supplements may contain high levels of biotin.  The higher biotin concentration in these specimens interferes with this Free T4 assay.  Specimens that contain high levels  of biotin may cause false high results for this Free T4 assay.  Please interpret results in light of the total clinical presentation of the patient.     Normal TFTs.  Carlus Pavlov, MD PhD [facility] Endocrinology

## 2019-05-29 NOTE — Patient Instructions (Signed)
Please stop at the lab.  Please continue Synthroid 150 mcg daily.  Take the thyroid hormone every day, with water, at least 30 minutes before breakfast, separated by at least 4 hours from: - acid reflux medications - calcium - iron - multivitamins  Please come back for a follow-up appointment in 1 year. 

## 2019-05-30 LAB — TSH: TSH: 2.53 u[IU]/mL (ref 0.35–4.50)

## 2019-05-30 LAB — T4, FREE: Free T4: 1.12 ng/dL (ref 0.60–1.60)

## 2019-06-01 ENCOUNTER — Ambulatory Visit: Payer: 59 | Admitting: Psychiatry

## 2019-06-03 DIAGNOSIS — N486 Induration penis plastica: Secondary | ICD-10-CM | POA: Diagnosis not present

## 2019-06-05 ENCOUNTER — Encounter: Payer: Self-pay | Admitting: Psychiatry

## 2019-06-05 ENCOUNTER — Ambulatory Visit (INDEPENDENT_AMBULATORY_CARE_PROVIDER_SITE_OTHER): Payer: BC Managed Care – PPO | Admitting: Psychiatry

## 2019-06-05 ENCOUNTER — Other Ambulatory Visit: Payer: Self-pay

## 2019-06-05 DIAGNOSIS — F4001 Agoraphobia with panic disorder: Secondary | ICD-10-CM

## 2019-06-05 DIAGNOSIS — F5105 Insomnia due to other mental disorder: Secondary | ICD-10-CM | POA: Diagnosis not present

## 2019-06-05 DIAGNOSIS — F411 Generalized anxiety disorder: Secondary | ICD-10-CM

## 2019-06-05 DIAGNOSIS — F10230 Alcohol dependence with withdrawal, uncomplicated: Secondary | ICD-10-CM | POA: Diagnosis not present

## 2019-06-05 MED ORDER — TRAZODONE HCL 50 MG PO TABS: 50.0000 mg | ORAL_TABLET | Freq: Every day | ORAL | 1 refills | Status: DC

## 2019-06-05 NOTE — Progress Notes (Signed)
LEM Watts [id number] [birth year] 44 y.o.  Subjective:   Patient ID:  Micheal Watts is a 44 y.o. (DOB 02/07/1976) male.  Chief Complaint:  Chief Complaint  Patient presents with  . Follow-up     Medication Management  . Anxiety     Medication Management  . Other    Bipolar 2    HPI Micheal Watts presents to the office today for follow-up of mood and anxiety.  Last seen April 2020.  We made the following change: Decrease lithium to 900 mg daily.  Because of no additional benefit at 1200 mg daily.  He has been Off lamotrigine since about February 2020.  He remained on paroxetine 30 mg daily.  Separated from wife and she won't let him see the kids.  A little trouble sexually too.  Problems with alcohol and usually don't drink much 4/night.  But got shakes and sweats and irritable and cussed mother without alcohol. No DUI.  Only drink at home.  4 shots/day and measures it out daily.  No other sig anger outbursts lately.  Trying CBT from internet helped his anxiety.    Trouble staying asleep for a couple of months.  4-5 hours sleep and normal is 7 hours.  Never had a detox before.    Increased sleep need somewhat.  Dx short sleeper in college 5-6 hours normal.   Caffeine 4 cups daily. Unchanged.  Past Psychiatric Medication Trials: Paroxetine 40, lamotrigine 200, lithium 1200, propranolol as needed, sertraline 200, nortriptyline 100 side effects, Trintellix, Vraylar, venlafaxine side effects, will Lexapro no response at low dose  Review of Systems:  Review of Systems  Constitutional: Negative for fatigue.  Neurological: Negative for tremors and weakness.    Medications: I have reviewed the patient's current medications.  Current Outpatient Medications  Medication Sig Dispense Refill  . ALPRAZolam (XANAX) 1 MG tablet TAKE 1 TABLET BY MOUTH DAILY AS NEEDED FOR PANIC 20 tablet 0  . cyclobenzaprine (FLEXERIL) 10 MG tablet Take 10 mg by mouth 3 (three) times daily  as needed for muscle spasms.    Marland Kitchen lithium carbonate 300 MG capsule Take 3 capsules (900 mg total) by mouth at bedtime. (Patient taking differently: Take 1,200 mg by mouth at bedtime. ) 90 capsule 5  . pantoprazole (PROTONIX) 40 MG tablet Take 40 mg by mouth 2 (two) times daily.     Marland Kitchen PARoxetine (PAXIL) 20 MG tablet TAKE 1 AND 1/2 TABLETS BY MOUTH DAILY 45 tablet 2  . SYNTHROID 150 MCG tablet Take 1 tablet (150 mcg total) by mouth daily before breakfast. 30 tablet 11   No current facility-administered medications for this visit.    Medication Side Effects: Fatigue  Allergies:  Allergies  Allergen Reactions  . Avelox [Moxifloxacin]     Past Medical History:  Diagnosis Date  . Anemia   . Anxiety   . Asthma   . Chicken pox   . Costochondritis   . GERD (gastroesophageal reflux disease)   . Headache     History reviewed. No pertinent family history.  Social History   Socioeconomic History  . Marital status: Married    Spouse name: Not on file  . Number of children: Not on file  . Years of education: Not on file  . Highest education level: Not on file  Occupational History  . Not on file  Tobacco Use  . Smoking status: Former Research scientist (life sciences)  . Smokeless tobacco: Never Used  Substance and Sexual Activity  . Alcohol  use: No  . Drug use: No  . Sexual activity: Not on file  Other Topics Concern  . Not on file  Social History Narrative  . Not on file   Social Determinants of Health   Financial Resource Strain:   . Difficulty of Paying Living Expenses: Not on file  Food Insecurity:   . Worried About Programme researcher, broadcasting/film/video in the Last Year: Not on file  . Ran Out of Food in the Last Year: Not on file  Transportation Needs:   . Lack of Transportation (Medical): Not on file  . Lack of Transportation (Non-Medical): Not on file  Physical Activity:   . Days of Exercise per Week: Not on file  . Minutes of Exercise per Session: Not on file  Stress:   . Feeling of Stress : Not on  file  Social Connections:   . Frequency of Communication with Friends and Family: Not on file  . Frequency of Social Gatherings with Friends and Family: Not on file  . Attends Religious Services: Not on file  . Active Member of Clubs or Organizations: Not on file  . Attends Banker Meetings: Not on file  . Marital Status: Not on file  Intimate Partner Violence:   . Fear of Current or Ex-Partner: Not on file  . Emotionally Abused: Not on file  . Physically Abused: Not on file  . Sexually Abused: Not on file    Past Medical History, Surgical history, Social history, and Family history were reviewed and updated as appropriate.   Please see review of systems for further details on the patient's review from today.   Objective:   Physical Exam:  There were no vitals taken for this visit.  Physical Exam Constitutional:      General: He is not in acute distress.    Appearance: He is well-developed.  Musculoskeletal:        General: No deformity.  Neurological:     Mental Status: He is alert and oriented to person, place, and time.     Cranial Nerves: No dysarthria.     Coordination: Coordination normal.  Psychiatric:        Attention and Perception: Attention and perception normal. He does not perceive auditory or visual hallucinations.        Mood and Affect: Mood is anxious. Mood is not depressed. Affect is not labile, blunt, angry or inappropriate.        Speech: Speech normal.        Behavior: Behavior normal. Behavior is cooperative.        Thought Content: Thought content normal. Thought content is not paranoid or delusional. Thought content does not include homicidal or suicidal ideation. Thought content does not include homicidal or suicidal plan.        Cognition and Memory: Cognition and memory normal.        Judgment: Judgment normal.     Comments: Anxiety is better since the last visit.  The lithium has helped his anxiety.     Lab Review:  No results  found for: NA, K, CL, CO2, GLUCOSE, BUN, CREATININE, CALCIUM, PROT, ALBUMIN, AST, ALT, ALKPHOS, BILITOT, GFRNONAA, GFRAA  No results found for: WBC, RBC, HGB, HCT, PLT, MCV, MCH, MCHC, RDW, LYMPHSABS, MONOABS, EOSABS, BASOSABS  No results found for: POCLITH, LITHIUM   No results found for: PHENYTOIN, PHENOBARB, VALPROATE, CBMZ   .res Assessment: Plan:    Alcohol dependence with uncomplicated withdrawal (HCC)  Generalized anxiety disorder  Panic  disorder with agoraphobia  Insomnia due to mental condition  Rule out bipolar 2 or other mood disorder.  Overall his anxiety and depression and irritability are manageable at this point.  However he has had the additional stress of marital separation and his wife is not allowing him to see his children.  He denies that this is connected with his anger or alcohol use.  He states he is using alcohol excessively and wants to know how to get off of it.  We discussed the possibility of using benzodiazepine tapers.  I do not believe he meets criteria for inpatient detox but he has had withdrawal symptoms when he tries to cut back alcohol abruptly.  He is researched on the Internet using a gradual taper with alcohol.  We discussed this extensively and how most people are not able to manage that because of the disinhibiting effects of alcohol.  We extensively discussed the alternative of using benzodiazepines to work drawl but they have a greater risk of excessive sedation upfront.  Continue lithium to 900 mg daily.  Because of no additional benefit at 1200 mg daily.  Disc caffeine and irritability. 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  He plans to see a urologist about this.  Discussed risk of both alcohol and paroxetine of interfering with sexual functioning as well.  Counseled patient regarding potential benefits, risks, and side effects of lithium  to include potential risk of lithium affecting thyroid and renal function.  Discussed need for periodic lab monitoring to determine drug level and to assess for potential adverse effects.  Counseled patient regarding signs and symptoms of lithium toxicity and advised that they notify office immediately or seek urgent medical attention if experiencing these signs and symptoms.  Patient advised to contact office with any questions or concerns. His level at 900 mg daily was 0.7 in December.  2019  This was a 30-minute appointment  Micheal Staggers MD, DFAPA  Please see After Visit Summary for patient specific instructions.  Future Appointments  Date Time Provider Department Center  08/03/2019  9:30 AM Cottle, Steva Ready., MD [facility] None  05/30/2020  8:00 AM Carlus Pavlov, MD [facility] None    No orders of the defined types were placed in this encounter.     -------------------------------

## 2019-06-05 NOTE — Patient Instructions (Signed)
Reduce alcohol by 1/2 shot every 3 days  Trazodone as needed for sleep

## 2019-06-06 DIAGNOSIS — F4320 Adjustment disorder, unspecified: Secondary | ICD-10-CM | POA: Diagnosis not present

## 2019-06-26 DIAGNOSIS — N522 Drug-induced erectile dysfunction: Secondary | ICD-10-CM | POA: Diagnosis not present

## 2019-06-26 DIAGNOSIS — N486 Induration penis plastica: Secondary | ICD-10-CM | POA: Diagnosis not present

## 2019-06-27 DIAGNOSIS — F4320 Adjustment disorder, unspecified: Secondary | ICD-10-CM | POA: Diagnosis not present

## 2019-08-03 ENCOUNTER — Ambulatory Visit (INDEPENDENT_AMBULATORY_CARE_PROVIDER_SITE_OTHER): Payer: BC Managed Care – PPO | Admitting: Psychiatry

## 2019-08-03 ENCOUNTER — Encounter: Payer: Self-pay | Admitting: Psychiatry

## 2019-08-03 DIAGNOSIS — F10230 Alcohol dependence with withdrawal, uncomplicated: Secondary | ICD-10-CM

## 2019-08-03 DIAGNOSIS — F5105 Insomnia due to other mental disorder: Secondary | ICD-10-CM | POA: Diagnosis not present

## 2019-08-03 DIAGNOSIS — F4001 Agoraphobia with panic disorder: Secondary | ICD-10-CM

## 2019-08-03 DIAGNOSIS — F411 Generalized anxiety disorder: Secondary | ICD-10-CM

## 2019-08-03 NOTE — Progress Notes (Signed)
CHIBUEZE BEASLEY [id number] April 20, 1976 44 y.o.  Subjective:   Patient ID:  ARMANDO BUKHARI is a 44 y.o. (DOB 1975/10/05) male.  Chief Complaint:  Chief Complaint  Patient presents with  . Other    Panic disorder with agoraphobia  . Follow-up    alcohol, mood    HPI BAYLON SANTELLI presents to the office today for follow-up of mood and anxiety.  seen April 2020.  We made the following change: Decrease lithium to 900 mg daily.  Because of no additional benefit at 1200 mg daily.  He has been Off lamotrigine since about February 2020.  He remained on paroxetine 30 mg daily.  Last seen January 2021.  He noted: Separated from wife and she won't let him see the kids.  A little trouble sexually too.  Problems with alcohol and usually don't drink much 4/night.  But got shakes and sweats and irritable and cussed mother without alcohol. No DUI.  Only drink at home.  4 shots/day and measures it out daily.  No other sig anger outbursts lately.  Trying CBT from internet helped his anxiety.  He wanted information about how to use alcohol to taper off alcohol.  This was discussed as well as the risks of doing so.  He was offered other forms of detox. Higher alcohol started in July after wife took kids.   No reconciliation possible.  Still hasn't been to court over bc Covid.  Victorino Dike is bashing him.  Able to reduce to 2 drinks at most /day and that's where my body starts resisting.  Reduced dip to 1 can/9 days but can't fully stop.  Reduced alcohol from 7 oz to 2 oz daily.   Would like to be more in control but otherwise feels much better.  Worked on mindfulness and helped  It out.  Sending money to kids with separation.  Feels more mental clarity with less substances and less lithium and paroxetine.   Patient reports stable mood and denies depressed or irritable moods.  Patient denies any recent difficulty with anxiety.  Less panic.  Patient denies difficulty with sleep initiation or maintenance.  Denies appetite disturbance.  Patient reports that energy and motivation have been good.  Patient denies any difficulty with concentration.  Patient denies any suicidal ideation.  Couple outbursts of anger.   Trouble staying asleep for a couple of months.  4-5 hours sleep and normal is 7 hours.  Never had a detox before.    Increased sleep need somewhat.  Dx short sleeper in college 5-6 hours normal.   Caffeine 4 cups daily. Unchanged.  Past Psychiatric Medication Trials: Paroxetine 40, lamotrigine 200, lithium 1200, propranolol as needed, sertraline 200, nortriptyline 100 side effects, Trintellix, Vraylar, venlafaxine side effects, will Lexapro no response at low dose  Review of Systems:  Review of Systems  Constitutional: Negative for fatigue.  Neurological: Negative for tremors and weakness.  Psychiatric/Behavioral: Negative for agitation.    Medications: I have reviewed the patient's current medications.  Current Outpatient Medications  Medication Sig Dispense Refill  . ALPRAZolam (XANAX) 1 MG tablet TAKE 1 TABLET BY MOUTH DAILY AS NEEDED FOR PANIC 20 tablet 0  . cyclobenzaprine (FLEXERIL) 10 MG tablet Take 10 mg by mouth 3 (three) times daily as needed for muscle spasms.    Marland Kitchen lithium carbonate 300 MG capsule Take 3 capsules (900 mg total) by mouth at bedtime. (Patient taking differently: Take 600 mg by mouth at bedtime. ) 90 capsule 5  . pantoprazole (  PROTONIX) 40 MG tablet Take 40 mg by mouth 2 (two) times daily.     Marland Kitchen PARoxetine (PAXIL) 20 MG tablet TAKE 1 AND 1/2 TABLETS BY MOUTH DAILY (Patient taking differently: 10 mg. ) 45 tablet 2  . SYNTHROID 150 MCG tablet Take 1 tablet (150 mcg total) by mouth daily before breakfast. 30 tablet 11  . traZODone (DESYREL) 50 MG tablet Take 1-2 tablets (50-100 mg total) by mouth at bedtime. 60 tablet 1   No current facility-administered medications for this visit.    Medication Side Effects: Fatigue  Allergies:  Allergies  Allergen  Reactions  . Avelox [Moxifloxacin]     Past Medical History:  Diagnosis Date  . Anemia   . Anxiety   . Asthma   . Chicken pox   . Costochondritis   . GERD (gastroesophageal reflux disease)   . Headache     History reviewed. No pertinent family history.  Social History   Socioeconomic History  . Marital status: Married    Spouse name: Not on file  . Number of children: Not on file  . Years of education: Not on file  . Highest education level: Not on file  Occupational History  . Not on file  Tobacco Use  . Smoking status: Former Research scientist (life sciences)  . Smokeless tobacco: Never Used  Substance and Sexual Activity  . Alcohol use: No  . Drug use: No  . Sexual activity: Not on file  Other Topics Concern  . Not on file  Social History Narrative  . Not on file   Social Determinants of Health   Financial Resource Strain:   . Difficulty of Paying Living Expenses:   Food Insecurity:   . Worried About Charity fundraiser in the Last Year:   . Arboriculturist in the Last Year:   Transportation Needs:   . Film/video editor (Medical):   Marland Kitchen Lack of Transportation (Non-Medical):   Physical Activity:   . Days of Exercise per Week:   . Minutes of Exercise per Session:   Stress:   . Feeling of Stress :   Social Connections:   . Frequency of Communication with Friends and Family:   . Frequency of Social Gatherings with Friends and Family:   . Attends Religious Services:   . Active Member of Clubs or Organizations:   . Attends Archivist Meetings:   Marland Kitchen Marital Status:   Intimate Partner Violence:   . Fear of Current or Ex-Partner:   . Emotionally Abused:   Marland Kitchen Physically Abused:   . Sexually Abused:     Past Medical History, Surgical history, Social history, and Family history were reviewed and updated as appropriate.   Please see review of systems for further details on the patient's review from today.   Objective:   Physical Exam:  There were no vitals taken for  this visit.  Physical Exam Constitutional:      General: He is not in acute distress.    Appearance: He is well-developed.  Musculoskeletal:        General: No deformity.  Neurological:     Mental Status: He is alert and oriented to person, place, and time.     Cranial Nerves: No dysarthria.     Coordination: Coordination normal.  Psychiatric:        Attention and Perception: Attention and perception normal. He does not perceive auditory or visual hallucinations.        Mood and Affect: Mood is anxious.  Mood is not depressed. Affect is not labile, blunt, angry or inappropriate.        Speech: Speech normal.        Behavior: Behavior normal. Behavior is cooperative.        Thought Content: Thought content normal. Thought content is not paranoid or delusional. Thought content does not include homicidal or suicidal ideation. Thought content does not include homicidal or suicidal plan.        Cognition and Memory: Cognition and memory normal.        Judgment: Judgment normal.     Comments: Anxiety is better since the last visit.  The lithium has helped his anxiety.     Lab Review:  No results found for: NA, K, CL, CO2, GLUCOSE, BUN, CREATININE, CALCIUM, PROT, ALBUMIN, AST, ALT, ALKPHOS, BILITOT, GFRNONAA, GFRAA  No results found for: WBC, RBC, HGB, HCT, PLT, MCV, MCH, MCHC, RDW, LYMPHSABS, MONOABS, EOSABS, BASOSABS  No results found for: POCLITH, LITHIUM   No results found for: PHENYTOIN, PHENOBARB, VALPROATE, CBMZ   .res Assessment: Plan:    Alcohol dependence with uncomplicated withdrawal (HCC)  Generalized anxiety disorder  Panic disorder with agoraphobia  Insomnia due to mental condition  Rule out bipolar 2 or other mood disorder.  Overall his anxiety and depression and irritability are manageable at this point.  However he has had the additional stress of marital separation and his wife is not allowing him to see his children.  He denies that this is connected with his  anger or alcohol use.  He states he is using alcohol excessively and wants to know how to get off of it.  We discussed the possibility of using benzodiazepine tapers.  I do not believe he meets criteria for inpatient detox but he has had withdrawal symptoms when he tries to cut back alcohol abruptly.  He is researched on the Internet using a gradual taper with alcohol.  We discussed this extensively and how most people are not able to manage that because of the disinhibiting effects of alcohol.  We extensively discussed the alternative of using benzodiazepines to work drawl but they have a greater risk of excessive sedation upfront.  Reviewed purpose of each med.    Option increase  lithium to 900 mg daily if mood or anger worsens.  Because of no additional benefit at 1200 mg daily.  Disc caffeine and irritability. Reduce alcohol by 1/2 shot every 3 days Option medical taper if needed.  He'd rather not.  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  He plans to see a urologist about this.  Discussed risk of both alcohol and paroxetine of interfering with sexual functioning as well.  Counseled patient regarding potential benefits, risks, and side effects of lithium to include potential risk of lithium affecting thyroid and renal function.  Discussed need for periodic lab monitoring to determine drug level and to assess for potential adverse effects.  Counseled patient regarding signs and symptoms of lithium toxicity and advised that they notify office immediately or seek urgent medical attention if experiencing these signs and symptoms.  Patient advised to contact office with any questions or concerns. His level at 900 mg daily was 0.7 in December.  2019  This was a 30-minute appointment  FU 4 mos  Meredith Staggers MD, DFAPA  Please see After Visit Summary for patient specific instructions.  Future Appointments  Date Time Provider  Department Center  05/30/2020  8:00 AM Gherghe,  Silvestre Mesi, MD [facility] None    No orders of the defined types were placed in this encounter.     -------------------------------

## 2019-08-08 ENCOUNTER — Other Ambulatory Visit: Payer: Self-pay | Admitting: Psychiatry

## 2019-08-08 NOTE — Telephone Encounter (Signed)
You gave him #20 in Dec. Is this okay? Last apt this month

## 2019-08-21 DIAGNOSIS — K2270 Barrett's esophagus without dysplasia: Secondary | ICD-10-CM | POA: Diagnosis not present

## 2019-08-21 DIAGNOSIS — K219 Gastro-esophageal reflux disease without esophagitis: Secondary | ICD-10-CM | POA: Diagnosis not present

## 2019-09-02 ENCOUNTER — Other Ambulatory Visit: Payer: Self-pay | Admitting: Psychiatry

## 2019-09-02 NOTE — Telephone Encounter (Signed)
Patient taking this dose?

## 2019-10-29 DIAGNOSIS — F4320 Adjustment disorder, unspecified: Secondary | ICD-10-CM | POA: Diagnosis not present

## 2019-11-05 DIAGNOSIS — F4320 Adjustment disorder, unspecified: Secondary | ICD-10-CM | POA: Diagnosis not present

## 2019-11-12 DIAGNOSIS — F4320 Adjustment disorder, unspecified: Secondary | ICD-10-CM | POA: Diagnosis not present

## 2019-11-21 DIAGNOSIS — F4320 Adjustment disorder, unspecified: Secondary | ICD-10-CM | POA: Diagnosis not present

## 2019-11-29 DIAGNOSIS — F4320 Adjustment disorder, unspecified: Secondary | ICD-10-CM | POA: Diagnosis not present

## 2019-11-30 ENCOUNTER — Other Ambulatory Visit: Payer: Self-pay | Admitting: Psychiatry

## 2019-12-11 DIAGNOSIS — F4320 Adjustment disorder, unspecified: Secondary | ICD-10-CM | POA: Diagnosis not present

## 2020-01-01 DIAGNOSIS — F4320 Adjustment disorder, unspecified: Secondary | ICD-10-CM | POA: Diagnosis not present

## 2020-01-09 DIAGNOSIS — F4320 Adjustment disorder, unspecified: Secondary | ICD-10-CM | POA: Diagnosis not present

## 2020-01-21 DIAGNOSIS — F4320 Adjustment disorder, unspecified: Secondary | ICD-10-CM | POA: Diagnosis not present

## 2020-02-06 DIAGNOSIS — F4320 Adjustment disorder, unspecified: Secondary | ICD-10-CM | POA: Diagnosis not present

## 2020-02-10 DIAGNOSIS — Z20828 Contact with and (suspected) exposure to other viral communicable diseases: Secondary | ICD-10-CM | POA: Diagnosis not present

## 2020-02-19 DIAGNOSIS — F4320 Adjustment disorder, unspecified: Secondary | ICD-10-CM | POA: Diagnosis not present

## 2020-03-04 DIAGNOSIS — F4320 Adjustment disorder, unspecified: Secondary | ICD-10-CM | POA: Diagnosis not present

## 2020-03-18 DIAGNOSIS — F4320 Adjustment disorder, unspecified: Secondary | ICD-10-CM | POA: Diagnosis not present

## 2020-03-31 DIAGNOSIS — M722 Plantar fascial fibromatosis: Secondary | ICD-10-CM | POA: Diagnosis not present

## 2020-03-31 DIAGNOSIS — E039 Hypothyroidism, unspecified: Secondary | ICD-10-CM | POA: Diagnosis not present

## 2020-03-31 DIAGNOSIS — M545 Low back pain, unspecified: Secondary | ICD-10-CM | POA: Diagnosis not present

## 2020-03-31 DIAGNOSIS — E7800 Pure hypercholesterolemia, unspecified: Secondary | ICD-10-CM | POA: Diagnosis not present

## 2020-04-01 DIAGNOSIS — F4320 Adjustment disorder, unspecified: Secondary | ICD-10-CM | POA: Diagnosis not present

## 2020-04-15 DIAGNOSIS — F4320 Adjustment disorder, unspecified: Secondary | ICD-10-CM | POA: Diagnosis not present

## 2020-04-16 ENCOUNTER — Other Ambulatory Visit: Payer: Self-pay | Admitting: Internal Medicine

## 2020-04-16 ENCOUNTER — Other Ambulatory Visit: Payer: Self-pay | Admitting: Psychiatry

## 2020-04-16 DIAGNOSIS — M722 Plantar fascial fibromatosis: Secondary | ICD-10-CM | POA: Diagnosis not present

## 2020-04-20 NOTE — Telephone Encounter (Signed)
His last apt was March

## 2020-04-29 DIAGNOSIS — F4320 Adjustment disorder, unspecified: Secondary | ICD-10-CM | POA: Diagnosis not present

## 2020-05-27 DIAGNOSIS — F4320 Adjustment disorder, unspecified: Secondary | ICD-10-CM | POA: Diagnosis not present

## 2020-05-30 ENCOUNTER — Ambulatory Visit: Payer: BC Managed Care – PPO | Admitting: Internal Medicine

## 2020-05-30 ENCOUNTER — Encounter: Payer: Self-pay | Admitting: Internal Medicine

## 2020-05-30 ENCOUNTER — Other Ambulatory Visit: Payer: Self-pay

## 2020-05-30 VITALS — BP 130/88 | HR 68 | Ht 71.0 in | Wt 215.4 lb

## 2020-05-30 DIAGNOSIS — E039 Hypothyroidism, unspecified: Secondary | ICD-10-CM

## 2020-05-30 LAB — TSH: TSH: 3.17 u[IU]/mL (ref 0.35–4.50)

## 2020-05-30 LAB — T4, FREE: Free T4: 0.79 ng/dL (ref 0.60–1.60)

## 2020-05-30 MED ORDER — LEVOTHYROXINE SODIUM 150 MCG PO TABS: 150.0000 ug | ORAL_TABLET | Freq: Every day | ORAL | 3 refills | Status: DC

## 2020-05-30 NOTE — Patient Instructions (Signed)
Please stop at the lab.  Please continue Synthroid 150 mcg daily.  Take the thyroid hormone every day, with water, at least 30 minutes before breakfast, separated by at least 4 hours from: - acid reflux medications - calcium - iron - multivitamins  Please come back for a follow-up appointment in 1 year. 

## 2020-05-30 NOTE — Progress Notes (Signed)
Patient ID: Micheal Watts, male   DOB: [birth year], 45 y.o.   MRN: [id number]   This visit occurred during the SARS-CoV-2 public health emergency.  Safety protocols were in place, including screening questions prior to the visit, additional usage of staff PPE, and extensive cleaning of exam room while observing appropriate contact time as indicated for disinfecting solutions.   HPI  Micheal Watts is a 45 y.o.-year-old male, initially referred by his PCP, Dr. Larwance Sachs, returning for f/u for acquired hypothyroidism. Last visit 1 year ago.  He continues on Paxil (reduced dose) and occasional  lithium. Off Lamictal.  Before last visit, he changed his job and also separated from his wife.  In the last year, he has finalized his divorce and went to a custody process.  He is also preparing to leave the house in [place], where he bought land (~1h away).  Reviewed history: Pt. has been dx with hypothyroidism in 2003-05 (possibly 2/2 Graves ds.) >> Synthroid DAW 137 mcg alternating with 125 mcg daily in 05/2016.  We decrease LT4 to 112 mcg and separated it from Protonix, however, we had to increase the dose to 125 mcg daily in 11/2016, to 137 mcg daily in 12/2016, and to 115 02/2018.  Pt is on Synthroid 150 mcg daily, taken: - in am - fasting - at least 30 min from b'fast - no calcium - no iron - no multivitamins - + PPIs later in the day - not on Biotin  Reviewed his TFTs: Lab Results  Component Value Date   TSH 2.53 05/29/2019   TSH 0.96 03/07/2018   TSH 3.01 09/05/2017   TSH 2.59 04/11/2017   TSH 9.76 (H) 02/21/2017   TSH 13.29 (H) 01/17/2017   TSH 10.13 (H) 12/02/2016   TSH 6.86 (H) 10/21/2016   FREET4 1.12 05/29/2019   FREET4 0.91 03/07/2018   FREET4 0.87 09/05/2017   FREET4 1.00 04/11/2017   FREET4 1.00 02/21/2017   FREET4 0.87 01/17/2017   FREET4 0.74 12/02/2016   FREET4 0.93 10/21/2016     Pt denies: - feeling nodules in neck - hoarseness - dysphagia -  choking - SOB with lying down  She has + FH of thyroid disorders in: M and daughter. No FH of thyroid cancer. No h/o radiation tx to head or neck.  No seaweed or kelp. No recent contrast studies. No herbal supplements. No Biotin use. No recent steroids use.    Pt. also has a history of PACs, palpitations, GAD.  He is on propranolol as needed for anxiety/palpitations.  He seldom has to take this.  He was on a Mediterranean diet >> now regular diet.  ROS: Constitutional: + weight gain/no weight loss, no fatigue, no subjective hyperthermia, no subjective hypothermia Eyes: no blurry vision, no xerophthalmia ENT: no sore throat, no nodules palpated in neck, no dysphagia, no odynophagia, no hoarseness Cardiovascular: no CP/no SOB/no palpitations/no leg swelling Respiratory: no cough/no SOB/no wheezing Gastrointestinal: no N/no V/no D/no C/no acid reflux Musculoskeletal: no muscle aches/no joint aches Skin: no rashes, no hair loss Neurological: no tremors/no numbness/no tingling/no dizziness  I reviewed pt's medications, allergies, PMH, social hx, family hx, and changes were documented in the history of present illness. Otherwise, unchanged from my initial visit note.   Past Medical History:  Diagnosis Date  . Anemia   . Anxiety   . Asthma   . Chicken pox   . Costochondritis   . GERD (gastroesophageal reflux disease)   . Headache    Past  Surgical History:  Procedure Laterality Date  . ESOPHAGOGASTRODUODENOSCOPY (EGD) WITH PROPOFOL N/A 10/06/2015   Procedure: ESOPHAGOGASTRODUODENOSCOPY (EGD) WITH PROPOFOL;  Surgeon: Hulen Luster, MD;  Location: [facility] ENDOSCOPY;  Service: Gastroenterology;  Laterality: N/A;  . HERNIA REPAIR    . pilonidial      Social History   Social History  . Marital status: Married    Spouse name: N/A  . 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 Smoking status: Former Research scientist (life sciences), quit in 2003   . Smokeless  tobacco: Never Used  . Alcohol use No  . Drug use: No   Current Outpatient Medications on File Prior to Visit  Medication Sig Dispense Refill  . ALPRAZolam (XANAX) 1 MG tablet TAKE 1 TABLET BY MOUTH EVERY DAY AS NEEDED FOR PANIC 10 tablet 0  . cyclobenzaprine (FLEXERIL) 10 MG tablet Take 10 mg by mouth 3 (three) times daily as needed for muscle spasms.    Marland Kitchen lithium carbonate 300 MG capsule Take 3 capsules (900 mg total) by mouth at bedtime. (Patient taking differently: Take 600 mg by mouth at bedtime. ) 90 capsule 5  . pantoprazole (PROTONIX) 40 MG tablet Take 40 mg by mouth 2 (two) times daily.     Marland Kitchen PARoxetine (PAXIL) 20 MG tablet TAKE 1 AND 1/2 TABLETS BY MOUTH DAILY 135 tablet 0  . SYNTHROID 150 MCG tablet TAKE 1 TABLET BY MOUTH DAILY BEFORE BREAKFAST. 30 tablet 1  . traZODone (DESYREL) 50 MG tablet Take 1-2 tablets (50-100 mg total) by mouth at bedtime. 60 tablet 1   No current facility-administered medications on file prior to visit.   Allergies  Allergen Reactions  . Avelox [Moxifloxacin]    No family history on file.  PE: BP 130/88   Pulse 68   Ht 5\' 11"  (1.803 m)   Wt 215 lb 6.4 oz (97.7 kg)   SpO2 98%   BMI 30.04 kg/m  Wt Readings from Last 3 Encounters:  05/30/20 215 lb 6.4 oz (97.7 kg)  05/29/19 206 lb (93.4 kg)  03/07/18 197 lb (89.4 kg)   Constitutional: overweight, in NAD Eyes: PERRLA, EOMI, no exophthalmos ENT: moist mucous membranes, no thyromegaly, no cervical lymphadenopathy Cardiovascular: RRR, No MRG Respiratory: CTA B Gastrointestinal: abdomen soft, NT, ND, BS+ Musculoskeletal: no deformities, strength intact in all 4 Skin: moist, warm, no rashes Neurological: no tremor with outstretched hands, DTR normal in all 4  ASSESSMENT: 1. Aquired Hypothyroidism  PLAN:  1. Patient with longstanding, previously uncontrolled hypothyroidism, developed after spontaneous remission of Graves' disease he is on Synthroid d.a.w., with previously fluctuating TFTs  and a long history of palpitations with PACs, anxiety, hot flashes, associated with suppressed TSH in the past.  He is on a as needed beta-blocker.  2 years ago he started lithium, added to Paxil.  He initially gained a significant amount of weight after starting Paxil.  We discussed that with increased weight, his Synthroid dosing may need to be increased.  However, before last visit, he just started a vegetarian diet and felt much better on this.  We discussed that this may help reduce his weight. - latest thyroid labs reviewed with pt >> normal: Lab Results  Component Value Date   TSH 2.53 05/29/2019   - he continues on Synthroid d.a.w. 150 mcg daily, but he wonders whether generic levothyroxine well for him.  We discussed about advantages of using brand-name versus generic, however, for  him, states he has stable thyroid tests and feels well on the above dose, we can try the generic formulation.  I did advise him to let me know if this is not working well for him to switch back to the brand-name. - pt feels good on this dose, but gained 9 lbs since last OV - we discussed about taking the thyroid hormone every day, with water, >30 minutes before breakfast, separated by >4 hours from acid reflux medications, calcium, iron, multivitamins. Pt. is taking it correctly. - will check thyroid tests today: TSH and fT4 - If labs are abnormal, he will need to return for repeat TFTs in 1.5 months -I will see him back in 1 year  Needs generic LT4 refills.  Component     Latest Ref Rng & Units 05/30/2020  T4,Free(Direct)     0.60 - 1.60 ng/dL 2.90  TSH     2.11 - 1.55 uIU/mL 3.17   TFTs are normal.  We will call in Generic levothyroxine.  Carlus Pavlov, MD PhD [facility] Endocrinology

## 2020-06-03 ENCOUNTER — Encounter: Payer: Self-pay | Admitting: Psychiatry

## 2020-06-03 ENCOUNTER — Ambulatory Visit (INDEPENDENT_AMBULATORY_CARE_PROVIDER_SITE_OTHER): Payer: BC Managed Care – PPO | Admitting: Psychiatry

## 2020-06-03 ENCOUNTER — Other Ambulatory Visit: Payer: Self-pay

## 2020-06-03 DIAGNOSIS — F4001 Agoraphobia with panic disorder: Secondary | ICD-10-CM

## 2020-06-03 DIAGNOSIS — F5105 Insomnia due to other mental disorder: Secondary | ICD-10-CM

## 2020-06-03 DIAGNOSIS — F411 Generalized anxiety disorder: Secondary | ICD-10-CM

## 2020-06-03 MED ORDER — ALPRAZOLAM 1 MG PO TABS: ORAL_TABLET | ORAL | 2 refills | Status: AC

## 2020-06-03 NOTE — Progress Notes (Signed)
Micheal Watts [id number] 07/03/1975 45 y.o.  Subjective:   Patient ID:  Micheal Watts is a 45 y.o. (DOB 03/17/1976) male.  Chief Complaint:  Chief Complaint  Patient presents with  . Follow-up  . Anxiety  . Sleeping Problem  . Stress    HPI Micheal Watts presents to the office today for follow-up of mood and anxiety.  seen April 2020.  We made the following change: Decrease lithium to 900 mg daily.  Because of no additional benefit at 1200 mg daily.  He has been Off lamotrigine since about February 2020.  He remained on paroxetine 30 mg daily.  seen January 2021.  He noted: Separated from wife and she won't let him see the kids.  A little trouble sexually too.  Problems with alcohol and usually don't drink much 4/night.  But got shakes and sweats and irritable and cussed mother without alcohol. No DUI.  Only drink at home.  4 shots/day and measures it out daily.  No other sig anger outbursts lately.  Trying CBT from internet helped his anxiety.  He wanted information about how to use alcohol to taper off alcohol.  This was discussed as well as the risks of doing so.  He was offered other forms of detox. Higher alcohol started in July after wife took kids.   Able to reduce to 2 drinks at most /day and that's where my body starts resisting.  Reduced dip to 1 can/9 days but can't fully stop.  Reduced alcohol from 7 oz to 2 oz daily.   Would like to be more in control but otherwise feels much better.  Worked on mindfulness and helped  It out.  Sending money to kids with separation.  Feels more mental clarity with less substances and less lithium and paroxetine.   Patient reports stable mood and denies depressed or irritable moods.  Patient denies any recent difficulty with anxiety.  Less panic.  Patient denies difficulty with sleep initiation or maintenance. Denies appetite disturbance.  Patient reports that energy and motivation have been good.  Patient denies any difficulty with  concentration.  Patient denies any suicidal ideation.  Couple outbursts of anger.   Trouble staying asleep for a couple of months.  4-5 hours sleep and normal is 7 hours.  Increased sleep need somewhat.  Dx short sleeper in college 5-6 hours normal.   Caffeine 4 cups daily. Unchanged.   06/03/2020 appt with following noted: Has worked hard on mindfulness and CBT with Dr. Ave Filteragan with goal of reducing meds and managing anxiety and mental health better.  Been able to  Sleep 6-8 hours.  Still has EMA at times but not problematic.Marland Kitchen.   The more of my mind I get back the easier the reduction in meds and alcohol.  Excited to be on as little meds as he is. No withdrawal sx and down to paroxetine 10 mg for several months. 2 shots whiskey per day. And never more than 3.   Divorced now.  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 Rare Xanax. He reports that he has been denied access to his children because he takes psychiatric medications and has used alcohol.  He denies alcohol abuse.  He has discussed this extensively with his therapist.  He has moderated his alcohol use to 2 or at most 3 drinks daily.  He is not combining it with alprazolam. His panic attacks have not worsened as he has reduced the paroxetine.  He has worked  aggressively on cognitive behavior therapy with good success for managing his anxiety.  He is not currently depressed.  Past Psychiatric Medication Trials: Paroxetine 40, lamotrigine 200, lithium 1200, propranolol as needed, sertraline 200, nortriptyline 100 side effects, Trintellix, Vraylar, venlafaxine side effects, will Lexapro no response at low dose  Review of Systems:  Review of Systems  Constitutional: Negative for fatigue.  Cardiovascular: Negative for chest pain and palpitations.  Neurological: Negative for tremors and weakness.  Psychiatric/Behavioral: Negative for agitation.    Medications: I have reviewed the patient's current medications.  Current  Outpatient Medications  Medication Sig Dispense Refill  . cyclobenzaprine (FLEXERIL) 10 MG tablet Take 10 mg by mouth 3 (three) times daily as needed for muscle spasms.    Marland Kitchen levothyroxine (SYNTHROID) 150 MCG tablet Take 1 tablet (150 mcg total) by mouth daily. 90 tablet 3  . pantoprazole (PROTONIX) 40 MG tablet Take 40 mg by mouth 2 (two) times daily.     Marland Kitchen PARoxetine (PAXIL) 20 MG tablet TAKE 1 AND 1/2 TABLETS BY MOUTH DAILY (Patient taking differently: Take 10 mg by mouth daily.) 135 tablet 0  . ALPRAZolam (XANAX) 1 MG tablet TAKE 1 TABLET BY MOUTH EVERY DAY AS NEEDED FOR PANIC 20 tablet 2   No current facility-administered medications for this visit.    Medication Side Effects: Fatigue  Allergies:  Allergies  Allergen Reactions  . Avelox [Moxifloxacin]     Past Medical History:  Diagnosis Date  . Anemia   . Anxiety   . Asthma   . Chicken pox   . Costochondritis   . GERD (gastroesophageal reflux disease)   . Headache     History reviewed. No pertinent family history.  Social History   Socioeconomic History  . Marital status: Married    Spouse name: Not on file  . Number of children: Not on file  . Years of education: Not on file  . Highest education level: Not on file  Occupational History  . Not on file  Tobacco Use  . Smoking status: Former Games developer  . Smokeless tobacco: Never Used  Substance and Sexual Activity  . Alcohol use: No  . Drug use: No  . Sexual activity: Not on file  Other Topics Concern  . Not on file  Social History Narrative  . Not on file   Social Determinants of Health   Financial Resource Strain: Not on file  Food Insecurity: Not on file  Transportation Needs: Not on file  Physical Activity: Not on file  Stress: Not on file  Social Connections: Not on file  Intimate Partner Violence: Not on file    Past Medical History, Surgical history, Social history, and Family history were reviewed and updated as appropriate.   Please see  review of systems for further details on the patient's review from today.   Objective:   Physical Exam:  There were no vitals taken for this visit.  Physical Exam Constitutional:      General: He is not in acute distress.    Appearance: He is well-developed.  Musculoskeletal:        General: No deformity.  Neurological:     Mental Status: He is alert and oriented to person, place, and time.     Cranial Nerves: No dysarthria.     Coordination: Coordination normal.  Psychiatric:        Attention and Perception: Attention and perception normal. He does not perceive auditory or visual hallucinations.        Mood  and Affect: Mood is not anxious or depressed. Affect is not labile, blunt, angry or inappropriate.        Speech: Speech normal.        Behavior: Behavior normal. Behavior is cooperative.        Thought Content: Thought content normal. Thought content is not paranoid or delusional. Thought content does not include homicidal or suicidal ideation. Thought content does not include homicidal or suicidal plan.        Cognition and Memory: Cognition and memory normal.        Judgment: Judgment normal.     Comments: Anxiety managed.     Lab Review:  No results found for: NA, K, CL, CO2, GLUCOSE, BUN, CREATININE, CALCIUM, PROT, ALBUMIN, AST, ALT, ALKPHOS, BILITOT, GFRNONAA, GFRAA  No results found for: WBC, RBC, HGB, HCT, PLT, MCV, MCH, MCHC, RDW, LYMPHSABS, MONOABS, EOSABS, BASOSABS  No results found for: POCLITH, LITHIUM   No results found for: PHENYTOIN, PHENOBARB, VALPROATE, CBMZ   .res Assessment: Plan:    Generalized anxiety disorder - Plan: ALPRAZolam (XANAX) 1 MG tablet  Panic disorder with agoraphobia - Plan: ALPRAZolam (XANAX) 1 MG tablet  Insomnia due to mental condition  Rule out bipolar 2 or other mood disorder.  Overall his anxiety and depression and irritability are manageable at this point.  However he has had the additional stress of divorce and his wife  is not allowing him to see his children.  There is no psychiatric reason or justification for him to be prepared Hiba did from seeing or caring for his children.  There is no evidence that he is impaired either from psychiatric medication, psychiatric symptoms, or alcohol use from his function.  Objective evidence shows that he is successful in his career.  We extensively discussed his previous denominational affiliation that is very critical of psychiatric medication usage..  We discussed the medical model and that how that may be integrated with a spiritual perspective and including a specifically [place] perspective.Marland Kitchen  He understands this perspective and i his motivation to wean psychiatric medications is not limited to a psychiatric perspective but based on progress and counseling and more effective techniques that he has learned to manage his anxiety.  He has reduced the paroxetine to 10 mg daily for several months without worsening panic or anxiety.  He is not impaired from anxiety.  He ask about weaning completely off anxiety anxiety medications.  Given that he has below the typical therapeutic dose of paroxetine that is a reasonable.  He will reduce paroxetine to 5 mg daily for 2 weeks to reduce withdrawal risk and discontinue it.  We discussed the risk of relapse and that it does not represent moral spiritual failure if he needs to resume paroxetine with which is not a controlled substance.  He is not abusing alprazolam which is used rarely and not combined with alcohol.  It is unfortunate that his psychiatric history and psychiatric medications have been used against him apparently to prohibit his access to his children.  There is absolutely no evidence, to my knowledge, to justify this prohibition.  He is not currently abusing alcohol.  He has moderated usage.  Moderate alcohol use in combination with an SSRI and an otherwise stable patient is reasonable and not dangerous to either the patient  or others.  This physician offered to write a letter in support of the patient's pursuit of access to his children if needed for legal purposes.  Therapy issues in this visit revolved  around the issues as noted above both the stressors in relation to the divorce and access to his children as well as the spiritual and psychological issues around potential need for psychiatric medication and the moral issues involved.  Follow-up as needed if there is recurrence of anxiety off of paroxetine or if there is continued need for as needed alprazolam which is used rarely at this time.  This session was 50 minutes in length.  Meredith Staggers MD, DFAPA  Please see After Visit Summary for patient specific instructions.  Future Appointments  Date Time Provider Department Center  06/05/2021  3:40 PM Carlus Pavlov, MD [id number] None    No orders of the defined types were placed in this encounter.     -------------------------------

## 2020-06-10 DIAGNOSIS — F4320 Adjustment disorder, unspecified: Secondary | ICD-10-CM | POA: Diagnosis not present

## 2020-06-16 DIAGNOSIS — U071 COVID-19: Secondary | ICD-10-CM | POA: Diagnosis not present

## 2020-06-24 DIAGNOSIS — F4320 Adjustment disorder, unspecified: Secondary | ICD-10-CM | POA: Diagnosis not present

## 2020-07-22 DIAGNOSIS — F4320 Adjustment disorder, unspecified: Secondary | ICD-10-CM | POA: Diagnosis not present

## 2020-08-25 DIAGNOSIS — F4320 Adjustment disorder, unspecified: Secondary | ICD-10-CM | POA: Diagnosis not present

## 2020-09-22 DIAGNOSIS — F4320 Adjustment disorder, unspecified: Secondary | ICD-10-CM | POA: Diagnosis not present

## 2020-10-15 DIAGNOSIS — F4320 Adjustment disorder, unspecified: Secondary | ICD-10-CM | POA: Diagnosis not present

## 2020-11-10 ENCOUNTER — Encounter: Payer: Self-pay | Admitting: Internal Medicine

## 2020-11-13 DIAGNOSIS — F4320 Adjustment disorder, unspecified: Secondary | ICD-10-CM | POA: Diagnosis not present

## 2020-11-18 ENCOUNTER — Other Ambulatory Visit: Payer: Self-pay | Admitting: Internal Medicine

## 2020-11-18 DIAGNOSIS — E039 Hypothyroidism, unspecified: Secondary | ICD-10-CM

## 2021-05-25 ENCOUNTER — Other Ambulatory Visit: Payer: Self-pay | Admitting: Internal Medicine

## 2021-06-05 ENCOUNTER — Encounter: Payer: Self-pay | Admitting: Internal Medicine

## 2021-06-05 ENCOUNTER — Ambulatory Visit (INDEPENDENT_AMBULATORY_CARE_PROVIDER_SITE_OTHER): Payer: BC Managed Care – PPO | Admitting: Internal Medicine

## 2021-06-05 ENCOUNTER — Other Ambulatory Visit: Payer: Self-pay

## 2021-06-05 ENCOUNTER — Ambulatory Visit: Payer: BC Managed Care – PPO | Admitting: Internal Medicine

## 2021-06-05 VITALS — BP 120/82 | HR 80 | Ht 71.0 in | Wt 217.6 lb

## 2021-06-05 DIAGNOSIS — E039 Hypothyroidism, unspecified: Secondary | ICD-10-CM | POA: Diagnosis not present

## 2021-06-05 NOTE — Progress Notes (Signed)
Patient ID: BRUK TUMOLO, male   DOB: 24-Dec-1975, 46 y.o.   MRN: [id number]   This visit occurred during the SARS-CoV-2 public health emergency.  Safety protocols were in place, including screening questions prior to the visit, additional usage of staff PPE, and extensive cleaning of exam room while observing appropriate contact time as indicated for disinfecting solutions.   HPI  Micheal Watts is a 46 y.o.-year-old male, initially referred by his PCP, Dr. Larwance Sachs, returning for f/u for acquired hypothyroidism. Last visit 1 year ago.  Interim history: He is off his psychotropic medications that she was able to taper Paxil off.  Previously he was also on lithium and Lamictal, then came off. He started a high stress job in 04/22.  He is on call 24/7, and has to go in several nights a week.  Because of this, he sometimes misses the levothyroxine in the morning, but this happens rarely.  Reviewed and addended history: Pt. has been dx with hypothyroidism in 2003-05 (possibly 2/2 Graves ds.) >> Synthroid DAW 137 mcg alternating with 125 mcg daily in 05/2016.  We decrease LT4 to 112 mcg and separated it from Protonix, however, we had to increase the dose to 125 mcg daily in 11/2016, to 137 mcg daily in 12/2016, and to 150 02/2018. In 05/2020, we switched from Synthroid to levothyroxine generic  Pt is on levothyroxine 150 mcg daily, taken: - misses doses <1x a mo - in am - fasting - at least 30 min from b'fast - no calcium - no iron - no multivitamins - + PPIs later in the day (Protonix) - not on Biotin  Reviewed his TFTs: Lab Results  Component Value Date   TSH 3.17 05/30/2020   TSH 2.53 05/29/2019   TSH 0.96 03/07/2018   TSH 3.01 09/05/2017   TSH 2.59 04/11/2017   TSH 9.76 (H) 02/21/2017   TSH 13.29 (H) 01/17/2017   TSH 10.13 (H) 12/02/2016   TSH 6.86 (H) 10/21/2016   FREET4 0.79 05/30/2020   FREET4 1.12 05/29/2019   FREET4 0.91 03/07/2018   FREET4 0.87 09/05/2017    FREET4 1.00 04/11/2017   FREET4 1.00 02/21/2017   FREET4 0.87 01/17/2017   FREET4 0.74 12/02/2016   FREET4 0.93 10/21/2016     Pt denies: - feeling nodules in neck - hoarseness - dysphagia - choking - SOB with lying down  He has + FH of thyroid disorders in: M and daughter. No FH of thyroid cancer. No h/o radiation tx to head or neck. No seaweed or kelp. No recent contrast studies. No herbal supplements. No Biotin use. No recent steroids use.    Pt. also has a history of PACs, palpitations, GAD.  He was on propranolol as needed for anxiety/palpitations.  ROS: + see HPI  I reviewed pt's medications, allergies, PMH, social hx, family hx, and changes were documented in the history of present illness. Otherwise, unchanged from my initial visit note.   Past Medical History:  Diagnosis Date   Anemia    Anxiety    Asthma    Chicken pox    Costochondritis    GERD (gastroesophageal reflux disease)    Headache    Past Surgical History:  Procedure Laterality Date   ESOPHAGOGASTRODUODENOSCOPY (EGD) WITH PROPOFOL N/A 10/06/2015   Procedure: ESOPHAGOGASTRODUODENOSCOPY (EGD) WITH PROPOFOL;  Surgeon: Wallace Cullens, MD;  Location: [facility] ENDOSCOPY;  Service: Gastroenterology;  Laterality: N/A;   HERNIA REPAIR     pilonidial      Social History  Social History   Marital status: Married    Spouse name: N/A   Number of children: 5-13   Occupational History    electrician    Social History Main Topics   Smoking status: Former Games developer, quit in 2003    Smokeless tobacco: Never Used   Alcohol use No   Drug use: No   Current Outpatient Medications on File Prior to Visit  Medication Sig Dispense Refill   ALPRAZolam (XANAX) 1 MG tablet TAKE 1 TABLET BY MOUTH EVERY DAY AS NEEDED FOR PANIC 20 tablet 2   cyclobenzaprine (FLEXERIL) 10 MG tablet Take 10 mg by mouth 3 (three) times daily as needed for muscle spasms.     levothyroxine (SYNTHROID) 150 MCG tablet TAKE 1 TABLET BY MOUTH DAILY 30  tablet 1   pantoprazole (PROTONIX) 40 MG tablet Take 40 mg by mouth 2 (two) times daily.      PARoxetine (PAXIL) 20 MG tablet TAKE 1 AND 1/2 TABLETS BY MOUTH DAILY (Patient taking differently: Take 10 mg by mouth daily.) 135 tablet 0   No current facility-administered medications on file prior to visit.   Allergies  Allergen Reactions   Avelox [Moxifloxacin]    No family history on file.  PE: BP 120/82 (BP Location: Right Arm, Patient Position: Sitting, Cuff Size: Normal)    Pulse 80    Ht 5\' 11"  (1.803 m)    Wt 217 lb 9.6 oz (98.7 kg)    SpO2 97%    BMI 30.35 kg/m  Wt Readings from Last 3 Encounters:  06/05/21 217 lb 9.6 oz (98.7 kg)  05/30/20 215 lb 6.4 oz (97.7 kg)  05/29/19 206 lb (93.4 kg)   Constitutional: overweight, in NAD Eyes: PERRLA, EOMI, no exophthalmos ENT: moist mucous membranes, no thyromegaly, no cervical lymphadenopathy Cardiovascular: RRR, No MRG Respiratory: CTA B Musculoskeletal: no deformities, strength intact in all 4 Skin: moist, warm, no rashes Neurological: no tremor with outstretched hands, DTR normal in all 4  ASSESSMENT: 1. Aquired Hypothyroidism  PLAN:  1. Patient with longstanding, previously uncontrolled hypothyroidism, developed after spontaneous remission of Graves' disease.  He is on generic levothyroxine now, changed from Synthroid d.a.w. at last visit.  In the past, he had symptoms associated with a suppressed TSH and was on an as needed beta-blockers.  At that time, he had palpitations with PACs, anxiety, hot flashes.  3 years ago he started lithium, added to Paxil.  He initially gained a significant amount of weight after starting Paxil.  2 years ago, he started a vegetarian diet and felt much better afterwards.  However, before last visit, he gained an 18 pound weight gain in the previous 2 years. - latest thyroid labs reviewed with pt. >> normal: Lab Results  Component Value Date   TSH 3.17 05/30/2020  - he continues on LT4 150 mcg  daily - pt feels good on this dose.  Weight has not changed much since last visit.  He does not feel a difference after changing from brand name Synthroid to generic levothyroxine. - we discussed about taking the thyroid hormone every day, with water, >30 minutes before breakfast, separated by >4 hours from acid reflux medications, calcium, iron, multivitamins. Pt. is taking it correctly.  I advised him that if he misses 1 dose, he can take 2 the next day. - will check thyroid tests today: TSH and fT4 - If labs are abnormal, he will need to return for repeat TFTs in 1.5 months -I will see him back  in 1 year  Needs refills.  Component     Latest Ref Rng & Units 06/05/2021  T4,Free(Direct)     0.82 - 1.77 ng/dL 1.471.67  TSH     [phone] - 6.2134.500 uIU/mL 0.496  Normal TFTs.  Carlus Pavlovristina Bertin Inabinet, MD PhD [facility]

## 2021-06-05 NOTE — Patient Instructions (Addendum)
Please stop at the lab.  Please  continue Levothyroxine 150 mcg daily.  Take the thyroid hormone every day, with water, at least 30 minutes before breakfast, separated by at least 4 hours from: - acid reflux medications - calcium - iron - multivitamins  Please come back for a follow-up appointment in 1 year.  

## 2021-06-07 LAB — TSH: TSH: 0.496 u[IU]/mL (ref 0.450–4.500)

## 2021-06-07 LAB — T4, FREE: Free T4: 1.67 ng/dL (ref 0.82–1.77)

## 2021-06-08 MED ORDER — LEVOTHYROXINE SODIUM 150 MCG PO TABS: 150.0000 ug | ORAL_TABLET | Freq: Every day | ORAL | 3 refills | Status: DC

## 2021-07-16 ENCOUNTER — Encounter: Payer: Self-pay | Admitting: Cardiology

## 2021-07-16 ENCOUNTER — Ambulatory Visit: Payer: BC Managed Care – PPO | Admitting: Cardiology

## 2021-07-16 ENCOUNTER — Other Ambulatory Visit: Payer: Self-pay

## 2021-07-16 ENCOUNTER — Inpatient Hospital Stay: Payer: BC Managed Care – PPO

## 2021-07-16 VITALS — BP 131/80 | HR 79 | Temp 99.0°F | Ht 71.0 in | Wt 215.0 lb

## 2021-07-16 DIAGNOSIS — I499 Cardiac arrhythmia, unspecified: Secondary | ICD-10-CM

## 2021-07-16 NOTE — Progress Notes (Signed)
Patient referred by [name], MD for palpitations  Subjective:   Micheal Watts, male    DOB: 04-12-1976, 46 y.o.   MRN: [id number]   Chief Complaint  Patient presents with   irregular heartbeat   New Patient (Initial Visit)     HPI  46 y.o. 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  He denies any exertional chest pain, shortness of breath symptoms.  2 weeks ago, he had an episode of irregular heartbeat that lasted for about 30 minutes.  After this episode, he has several episodes of irregular heartbeat that lasts for a second or 2.  He denies any associated chest pain, shortness of air, presyncope, syncope with these episodes.  Patient has been on Synthroid for hypothyroidism for long time with well-controlled TSH.  He drinks 1-2 alcoholic drinks a day, drinks 1 cup coffee a day.  He was treated for anxiety by his PCP, with some improvement in his symptoms.    Past Medical History:  Diagnosis Date   Anemia    Anxiety    Asthma    Chicken pox    Costochondritis    GERD (gastroesophageal reflux disease)    Headache      Past Surgical History:  Procedure Laterality Date   ESOPHAGOGASTRODUODENOSCOPY (EGD) WITH PROPOFOL N/A 10/06/2015   Procedure: ESOPHAGOGASTRODUODENOSCOPY (EGD) WITH PROPOFOL;  Surgeon: Hulen Luster, MD;  Location: [facility] ENDOSCOPY;  Service: Gastroenterology;  Laterality: N/A;   HERNIA REPAIR     pilonidial        Social History   Tobacco Use  Smoking Status Former  Smokeless Tobacco Never    Social History   Substance and Sexual Activity  Alcohol Use No     Family History  Problem Relation Age of Onset   Fibromyalgia Mother      Current Outpatient Medications on File Prior to Visit  Medication Sig Dispense Refill   ALPRAZolam (XANAX) 1 MG tablet TAKE 1 TABLET BY MOUTH EVERY DAY AS NEEDED FOR PANIC 20 tablet 2   cyclobenzaprine (FLEXERIL)  10 MG tablet Take 10 mg by mouth 3 (three) times daily as needed for muscle spasms.     levothyroxine (SYNTHROID) 150 MCG tablet Take 1 tablet (150 mcg total) by mouth daily. 90 tablet 3   pantoprazole (PROTONIX) 40 MG tablet Take 40 mg by mouth 2 (two) times daily.      PARoxetine (PAXIL) 20 MG tablet TAKE 1 AND 1/2 TABLETS BY MOUTH DAILY (Patient not taking: Reported on 06/05/2021) 135 tablet 0   No current facility-administered medications on file prior to visit.    Cardiovascular and other pertinent studies:   EKG 07/04/2021: Sinus rhythm Normal EKG   Recent labs: 07/09/2021: Glucose 79, BUN/Cr 15/1.2. EGFR 75. K 4.7 Chol 203, TG 96, HDL 51, LDL 133 TSH 0.4normal    Review of Systems  Cardiovascular:  Positive for palpitations. Negative for chest pain, dyspnea on exertion, leg swelling and syncope.        Vitals:   07/16/21 0900  BP: 131/80  Pulse: 79  Temp: 99 F (37.2 C)  SpO2: 99%     Body mass index is 29.99 kg/m. Filed Weights   07/16/21 0900  Weight: 215 lb (97.5 kg)     Objective:   Physical Exam Vitals and nursing note reviewed.  Constitutional:      General: He is not in acute distress. Neck:  Vascular: No JVD.  Cardiovascular:     Rate and Rhythm: Normal rate and regular rhythm.     Heart sounds: Normal heart sounds. No murmur heard. Pulmonary:     Effort: Pulmonary effort is normal.     Breath sounds: Normal breath sounds. No wheezing or rales.  Musculoskeletal:     Right lower leg: No edema.     Left lower leg: No edema.        Assessment & Recommendations:    46 y.o. Caucasian male with hypothyroidism, referred for irregular heartbeat  Irregular heartbeat: Normal physical exam and resting EKG.  Symptoms most likely due to [facility] or PVC.  However, recent episode lasting for 30 minutes raises possibility of tachyarrhythmia like atrial fibrillation.  Recommend to be cardiac telemetry.  Further recommendations after above  testing.  Thank you for referring the patient to [place]. Please feel free to contact with any questions.   [name], MD Pager: [phone] Office: [phone]

## 2022-06-10 ENCOUNTER — Ambulatory Visit (INDEPENDENT_AMBULATORY_CARE_PROVIDER_SITE_OTHER): Payer: BC Managed Care – PPO | Admitting: Internal Medicine

## 2022-06-10 ENCOUNTER — Encounter: Payer: Self-pay | Admitting: Internal Medicine

## 2022-06-10 VITALS — BP 120/80 | HR 76 | Ht 71.0 in | Wt 234.6 lb

## 2022-06-10 DIAGNOSIS — E039 Hypothyroidism, unspecified: Secondary | ICD-10-CM

## 2022-06-10 NOTE — Patient Instructions (Addendum)
Please stop at the lab.  Please continue Levothyroxine 150 mcg daily.  Take the thyroid hormone every day, with water, at least 30 minutes before breakfast, separated by at least 4 hours from: - acid reflux medications - calcium - iron - multivitamins  Please come back for a follow-up appointment in 1 year.  Mobile cardiac telemetry 14 days 08/13/2021 - 08/30/2021: Dominant rhythm: Sinus. HR 49-138 bpm. Avg HR 82 bpm, in sinus rhythm. 1 episodes of SVT, fastest at 160 bpm for 4 beats. <1% isolated SVE, couplet. No triplets. 1 episodes of VT, fastest at 174 bpm for 12 beats, occurred at 1:09 AM. <1% isolated VE, no couplet/triplets. No atrial fibrillation/atrial flutter/high grade AV block, sinus pause >3sec noted. 6 patient triggered events, 2 correlated with SVT/SVE, the rest with sinus rhythm.

## 2022-06-10 NOTE — Progress Notes (Signed)
Patient ID: Micheal Watts, male   DOB: 11-09-1975, 47 y.o.   MRN: [id number]   HPI  Micheal Watts is a 47 y.o.-year-old male, initially referred by his PCP, Dr. Larwance Sachs, returning for f/u for acquired hypothyroidism. Last visit 1 year ago.  Interim history: Before last visit, he was about to come off his psychotropic medications -previously on the Paxil, lithium, Lamictal. He started a high stress job in 04/22.  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 He had palpitations >> had a Holter monitor >> never got the results... Since last visit, he gained weight, due to relaxing his diet.  Reviewed and addended history: Pt. has been dx with hypothyroidism in 2003-05 (possibly 2/2 Graves ds.) >> Synthroid DAW 137 mcg alternating with 125 mcg daily in 05/2016.  We decrease LT4 to 112 mcg and separated it from Protonix, however, we had to increase the dose to 125 mcg daily in 11/2016, to 137 mcg daily in 12/2016, and to 150 02/2018. In 05/2020, we switched from Synthroid to levothyroxine generic  Pt is on levothyroxine 150 mcg daily, taken: - in am - fasting - at least 30 min from b'fast - no calcium - no iron - no multivitamins - + PPIs later in the day (Protonix) - not on Biotin  Reviewed his TFTs: Lab Results  Component Value Date   TSH 0.496 06/05/2021   TSH 3.17 05/30/2020   TSH 2.53 05/29/2019   TSH 0.96 03/07/2018   TSH 3.01 09/05/2017   TSH 2.59 04/11/2017   TSH 9.76 (H) 02/21/2017   TSH 13.29 (H) 01/17/2017   TSH 10.13 (H) 12/02/2016   TSH 6.86 (H) 10/21/2016   FREET4 1.67 06/05/2021   FREET4 0.79 05/30/2020   FREET4 1.12 05/29/2019   FREET4 0.91 03/07/2018   FREET4 0.87 09/05/2017   FREET4 1.00 04/11/2017   FREET4 1.00 02/21/2017   FREET4 0.87 01/17/2017   FREET4 0.74 12/02/2016   FREET4 0.93 10/21/2016     Pt denies: - feeling nodules in neck - hoarseness - dysphagia - choking  He has + FH of thyroid disorders in: M and daughter. No FH of thyroid cancer. No  h/o radiation tx to head or neck. No herbal supplements. No Biotin use. No recent steroids use.    Pt. also has a history of PACs, palpitations, GAD.  He was on propranolol as needed for anxiety/palpitations.  ROS: + see HPI  I reviewed pt's medications, allergies, PMH, social hx, family hx, and changes were documented in the history of present illness. Otherwise, unchanged from my initial visit note.  Past Medical History:  Diagnosis Date   Anemia    Anxiety    Asthma    Chicken pox    Costochondritis    GERD (gastroesophageal reflux disease)    Headache    Past Surgical History:  Procedure Laterality Date   ESOPHAGOGASTRODUODENOSCOPY (EGD) WITH PROPOFOL N/A 10/06/2015   Procedure: ESOPHAGOGASTRODUODENOSCOPY (EGD) WITH PROPOFOL;  Surgeon: Wallace Cullens, MD;  Location: [facility] ENDOSCOPY;  Service: Gastroenterology;  Laterality: N/A;   HERNIA REPAIR     pilonidial      Social History   Social History   Marital status: Married    Spouse name: N/A   Number of children: 5-13   Occupational History    electrician    Social History Main Topics   Smoking status: Former Smoker, quit in 2003    Smokeless tobacco: Never Used   Alcohol use No   Drug use: No  Current Outpatient Medications on File Prior to Visit  Medication Sig Dispense Refill   ALPRAZolam (XANAX) 1 MG tablet TAKE 1 TABLET BY MOUTH EVERY DAY AS NEEDED FOR PANIC 20 tablet 2   cyclobenzaprine (FLEXERIL) 10 MG tablet Take 10 mg by mouth 3 (three) times daily as needed for muscle spasms.     levothyroxine (SYNTHROID) 150 MCG tablet Take 1 tablet (150 mcg total) by mouth daily. 90 tablet 3   meloxicam (MOBIC) 15 MG tablet Take 15 mg by mouth as needed.     pantoprazole (PROTONIX) 40 MG tablet Take 40 mg by mouth daily.     No current facility-administered medications on file prior to visit.   Allergies  Allergen Reactions   Moxifloxacin     Other reaction(s): hives   Family History  Problem Relation Age of Onset    Fibromyalgia Mother    PE: BP 120/80 (BP Location: Left Arm, Patient Position: Sitting, Cuff Size: Normal)   Pulse 76   Ht 5\' 11"  (1.803 m)   Wt 234 lb 9.6 oz (106.4 kg)   SpO2 99%   BMI 32.72 kg/m  Wt Readings from Last 3 Encounters:  06/10/22 234 lb 9.6 oz (106.4 kg)  07/16/21 215 lb (97.5 kg)  06/05/21 217 lb 9.6 oz (98.7 kg)   Constitutional: overweight, in NAD Eyes:  EOMI, no exophthalmos ENT: no neck masses, no cervical lymphadenopathy Cardiovascular: RRR, No MRG Respiratory: CTA B Musculoskeletal: no deformities Skin:no rashes Neurological: no tremor with outstretched hands  ASSESSMENT: 1. Aquired Hypothyroidism  PLAN:  1. Patient with longstanding, previously uncontrolled hypothyroidism, developed after spontaneous remission of Graves' disease.  He is on generic levothyroxine, previously on Synthroid d.a.w.  He was previously on Paxil and lithium and gained a significant amount of weight and also had palpitations, anxiety, hot flashes.  Approximately 3 years ago he started on a vegetarian diet and felt much better afterwards.  He did gain weight afterwards but he was still overall feeling better and he was able to come off his psychotropic medications.  The above symptoms improved. -At today's visit, he had a significant weight gain of 17 pounds since last visit.  He attributes this to relaxing his diet and increased stress. - latest thyroid labs reviewed with pt. >> normal: Lab Results  Component Value Date   TSH 0.496 06/05/2021  - he continues on LT4 150 mcg daily - pt feels good on this dose, but did have more palpitations at the beginning of last year and was seen by cardiology.  An EKG obtained in the office was normal per review of cardiology's note.  He wore an event monitor but he mentions that he was never given the results.  Reviewing his chart, he had the following findings (printed for and reviewed with the patient today): Mobile cardiac telemetry 14 days  08/13/2021 - 08/30/2021: Dominant rhythm: Sinus. HR 49-138 bpm. Avg HR 82 bpm, in sinus rhythm. 1 episodes of SVT, fastest at 160 bpm for 4 beats. <1% isolated SVE, couplet. No triplets. 1 episodes of VT, fastest at 174 bpm for 12 beats, occurred at 1:09 AM. <1% isolated VE, no couplet/triplets. No atrial fibrillation/atrial flutter/high grade AV block, sinus pause >3sec noted. 6 patient triggered events, 2 correlated with SVT/SVE, the rest with sinus rhythm.  - We discussed that an excessive dose of levothyroxine can cause palpitations and tachyarrhythmias.  We definitely need to make sure that he is not overly replaced with levothyroxine. - we discussed about taking  the thyroid hormone every day, with water, >30 minutes before breakfast, separated by >4 hours from acid reflux medications, calcium, iron, multivitamins. Pt. is taking it correctly. - will check thyroid tests today: TSH and fT4 - If labs are abnormal, he will need to return for repeat TFTs in 1.5 months - I we will see him back in a year  Needs refills.  Component     Latest Ref Rng 06/10/2022  TSH     0.35 - 5.50 uIU/mL 0.92   T4,Free(Direct)     0.60 - 1.60 ng/dL 1.04   Thyroid tests are normal.  Philemon Kingdom, MD PhD [facility]

## 2022-06-11 LAB — TSH: TSH: 0.92 u[IU]/mL (ref 0.35–5.50)

## 2022-06-11 LAB — T4, FREE: Free T4: 1.04 ng/dL (ref 0.60–1.60)

## 2022-06-11 MED ORDER — LEVOTHYROXINE SODIUM 150 MCG PO TABS: 150.0000 ug | ORAL_TABLET | Freq: Every day | ORAL | 3 refills | Status: DC

## 2022-07-18 ENCOUNTER — Encounter (HOSPITAL_BASED_OUTPATIENT_CLINIC_OR_DEPARTMENT_OTHER): Payer: Self-pay | Admitting: Emergency Medicine

## 2022-07-18 ENCOUNTER — Emergency Department (HOSPITAL_BASED_OUTPATIENT_CLINIC_OR_DEPARTMENT_OTHER)
Admission: EM | Admit: 2022-07-18 | Discharge: 2022-07-18 | Disposition: A | Discharge status: Home / Self Care | Payer: BC Managed Care – PPO | Attending: Emergency Medicine | Admitting: Emergency Medicine

## 2022-07-18 ENCOUNTER — Other Ambulatory Visit: Payer: Self-pay

## 2022-07-18 ENCOUNTER — Emergency Department (HOSPITAL_BASED_OUTPATIENT_CLINIC_OR_DEPARTMENT_OTHER): Payer: BC Managed Care – PPO

## 2022-07-18 DIAGNOSIS — J45909 Unspecified asthma, uncomplicated: Secondary | ICD-10-CM | POA: Diagnosis not present

## 2022-07-18 DIAGNOSIS — K5120 Ulcerative (chronic) proctitis without complications: Secondary | ICD-10-CM | POA: Diagnosis not present

## 2022-07-18 DIAGNOSIS — K6289 Other specified diseases of anus and rectum: Secondary | ICD-10-CM

## 2022-07-18 DIAGNOSIS — D72829 Elevated white blood cell count, unspecified: Secondary | ICD-10-CM | POA: Insufficient documentation

## 2022-07-18 LAB — BASIC METABOLIC PANEL
Anion gap: 8 (ref 5–15)
BUN: 13 mg/dL (ref 6–20)
CO2: 26 mmol/L (ref 22–32)
Calcium: 9.7 mg/dL (ref 8.9–10.3)
Chloride: 102 mmol/L (ref 98–111)
Creatinine, Ser: 1.04 mg/dL (ref 0.61–1.24)
GFR, Estimated: >60 mL/min (ref >60)
Glucose, Bld: 129 mg/dL — ABNORMAL HIGH (ref 70–99)
Potassium: 4.3 mmol/L (ref 3.5–5.1)
Sodium: 136 mmol/L (ref 135–145)

## 2022-07-18 LAB — CBC WITH DIFFERENTIAL/PLATELET
Abs Immature Granulocytes: 0.06 10*3/uL (ref 0.00–0.07)
Basophils Absolute: 0.1 10*3/uL (ref 0.0–0.1)
Basophils Relative: 1 %
Eosinophils Absolute: 0.2 10*3/uL (ref 0.0–0.5)
Eosinophils Relative: 2 %
HCT: 43.2 % (ref 39.0–52.0)
Hemoglobin: 14.5 g/dL (ref 13.0–17.0)
Immature Granulocytes: 0 %
Lymphocytes Relative: 13 %
Lymphs Abs: 2 10*3/uL (ref 0.7–4.0)
MCH: 29.2 pg (ref 26.0–34.0)
MCHC: 33.6 g/dL (ref 30.0–36.0)
MCV: 86.9 fL (ref 80.0–100.0)
Monocytes Absolute: 1.1 10*3/uL — ABNORMAL HIGH (ref 0.1–1.0)
Monocytes Relative: 7 %
Neutro Abs: 12.1 10*3/uL — ABNORMAL HIGH (ref 1.7–7.7)
Neutrophils Relative %: 77 %
Platelets: 319 10*3/uL (ref 150–400)
RBC: 4.97 MIL/uL (ref 4.22–5.81)
RDW: 13.1 % (ref 11.5–15.5)
WBC: 15.5 10*3/uL — ABNORMAL HIGH (ref 4.0–10.5)
nRBC: 0 % (ref 0.0–0.2)

## 2022-07-18 LAB — URINALYSIS, ROUTINE W REFLEX MICROSCOPIC
Bilirubin Urine: NEGATIVE
Glucose, UA: NEGATIVE mg/dL
Hgb urine dipstick: NEGATIVE
Ketones, ur: NEGATIVE mg/dL
Leukocytes,Ua: NEGATIVE
Nitrite: NEGATIVE
Protein, ur: NEGATIVE mg/dL
Specific Gravity, Urine: 1.011 (ref 1.005–1.030)
pH: 5.5 (ref 5.0–8.0)

## 2022-07-18 MED ORDER — OXYCODONE-ACETAMINOPHEN 5-325 MG PO TABS: 1.0000 | ORAL_TABLET | Freq: Three times a day (TID) | ORAL | 0 refills | Status: DC | PRN

## 2022-07-18 MED ORDER — AMOXICILLIN-POT CLAVULANATE 875-125 MG PO TABS
1.0000 | ORAL_TABLET | Freq: Once | ORAL | Status: AC
  Administered 2022-07-18: 1 via ORAL
  Filled 2022-07-18: qty 1

## 2022-07-18 MED ORDER — KETOROLAC TROMETHAMINE 15 MG/ML IJ SOLN
15.0000 mg | Freq: Once | INTRAMUSCULAR | Status: AC
  Administered 2022-07-18: 15 mg via INTRAMUSCULAR

## 2022-07-18 MED ORDER — SODIUM CHLORIDE 0.9 % IV BOLUS
1000.0000 mL | Freq: Once | INTRAVENOUS | Status: AC
  Administered 2022-07-18: 1000 mL via INTRAVENOUS

## 2022-07-18 MED ORDER — IOHEXOL 300 MG/ML  SOLN
100.0000 mL | Freq: Once | INTRAMUSCULAR | Status: AC | PRN
  Administered 2022-07-18: 100 mL via INTRAVENOUS

## 2022-07-18 MED ORDER — KETOROLAC TROMETHAMINE 15 MG/ML IJ SOLN
15.0000 mg | Freq: Once | INTRAMUSCULAR | Status: DC
  Filled 2022-07-18: qty 1

## 2022-07-18 MED ORDER — POLYETHYLENE GLYCOL 3350 17 G PO PACK: 17.0000 g | PACK | Freq: Every day | ORAL | 0 refills | Status: DC | PRN

## 2022-07-18 MED ORDER — AMOXICILLIN-POT CLAVULANATE 875-125 MG PO TABS: 1.0000 | ORAL_TABLET | Freq: Two times a day (BID) | ORAL | 0 refills | Status: DC

## 2022-07-18 NOTE — ED Notes (Signed)
Pt is back from CT

## 2022-07-18 NOTE — ED Provider Notes (Signed)
[facility] Provider Note   CSN: [id number] Arrival date & time: 07/18/22  1549     History  Chief Complaint  Patient presents with   Rectal Pain    Micheal Watts is a 47 y.o. male.  HPI Patient presents urgent care.  Rectal pain and pressure since yesterday.  Worse having a bowel movement.  No fevers.  Worse with sitting down.  States the pain initially started when to his sacral area.  Did have attempted anoscope by urgent care.  Reportedly got about an inch in and was limited by pain.  Reportedly no abscess seen and no fissure at that depth.   Past Medical History:  Diagnosis Date   Anemia    Anxiety    Asthma    Chicken pox    Costochondritis    GERD (gastroesophageal reflux disease)    Headache     Home Medications Prior to Admission medications   Medication Sig Start Date End Date Taking? Authorizing Provider  amoxicillin-clavulanate (AUGMENTIN) 875-125 MG tablet Take 1 tablet by mouth every 12 (twelve) hours. 07/18/22  Yes Davonna Belling, MD  oxyCODONE-acetaminophen (PERCOCET/ROXICET) 5-325 MG tablet Take 1-2 tablets by mouth every 8 (eight) hours as needed for severe pain. 07/18/22  Yes Davonna Belling, MD  polyethylene glycol (MIRALAX) 17 g packet Take 17 g by mouth daily as needed for mild constipation. 07/18/22  Yes Davonna Belling, MD  ALPRAZolam Duanne Moron) 1 MG tablet TAKE 1 TABLET BY MOUTH EVERY DAY AS NEEDED FOR PANIC 06/03/20   Cottle, Billey Co., MD  cyclobenzaprine (FLEXERIL) 10 MG tablet Take 10 mg by mouth 3 (three) times daily as needed for muscle spasms.    [provider]  levothyroxine (SYNTHROID) 150 MCG tablet Take 1 tablet (150 mcg total) by mouth daily. 06/11/22   Philemon Kingdom, MD  meloxicam (MOBIC) 15 MG tablet Take 15 mg by mouth as needed. 07/09/21   [provider]  pantoprazole (PROTONIX) 40 MG tablet Take 40 mg by mouth daily.    [provider]       Allergies    Moxifloxacin    Review of Systems   Review of Systems  Physical Exam Updated Vital Signs BP 118/76   Pulse 80   Temp 98.9 F (37.2 C) (Oral)   Resp 18   SpO2 99%  Physical Exam Vitals and nursing note reviewed.  Cardiovascular:     Rate and Rhythm: Regular rhythm. Tachycardia present.  Abdominal:     Tenderness: There is no abdominal tenderness.  Genitourinary:    Comments: Deferred for now since he just had attempted anoscope. Skin:    General: Skin is warm.     Capillary Refill: Capillary refill takes less than 2 seconds.  Neurological:     Mental Status: He is alert and oriented to person, place, and time.     ED Results / Procedures / Treatments   Labs (all labs ordered are listed, but only abnormal results are displayed) Labs Reviewed  CBC WITH DIFFERENTIAL/PLATELET - Abnormal; Notable for the following components:      Result Value   WBC 15.5 (*)    Neutro Abs 12.1 (*)    Monocytes Absolute 1.1 (*)    All other components within normal limits  BASIC METABOLIC PANEL - Abnormal; Notable for the following components:   Glucose, Bld 129 (*)    All other components within normal limits  URINALYSIS, ROUTINE W REFLEX MICROSCOPIC  EKG None  Radiology CT ABDOMEN PELVIS W CONTRAST  Result Date: 07/18/2022 CLINICAL DATA:  47 year old male with rectal pain. EXAM: CT ABDOMEN AND PELVIS WITH CONTRAST TECHNIQUE: Multidetector CT imaging of the abdomen and pelvis was performed using the standard protocol following bolus administration of intravenous contrast. RADIATION DOSE REDUCTION: This exam was performed according to the departmental dose-optimization program which includes automated exposure control, adjustment of the mA and/or kV according to patient size and/or use of iterative reconstruction technique. CONTRAST:  162m OMNIPAQUE IOHEXOL 300 MG/ML  SOLN COMPARISON:  None Available. FINDINGS: Lower chest: No acute abnormality. Hepatobiliary: The  liver and gallbladder are unremarkable except for mild hepatic steatosis. There is no evidence of intrahepatic or extrahepatic biliary dilatation. Pancreas: Unremarkable Spleen: Unremarkable Adrenals/Urinary Tract: A LEFT renal cyst is present for which no follow-up imaging is recommended. The kidneys, adrenal glands and bladder are otherwise unremarkable. There is no evidence of hydronephrosis or urinary calculi. Stomach/Bowel: There is mild rectal wall thickening and perirectal stranding/inflammation. A 0.5 x 1 cm slightly hypodense area in the region of the distal rectum/anus, equivocal for small abscess. There is no evidence of bowel wall thickening or other inflammatory changes noted. The appendix is normal. Vascular/Lymphatic: No significant vascular findings are present. No enlarged abdominal or pelvic lymph nodes. Reproductive: Prostate is unremarkable. Other: There is no evidence of ascites or pneumoperitoneum. A small umbilical hernia containing fat is noted. Musculoskeletal: No acute or suspicious bony abnormalities are noted. IMPRESSION: 1. Mild rectal wall thickening and perirectal stranding suspicious for proctitis. Equivocal 1 cm distal perirectal abscess. 2. Mild hepatic steatosis. 3. Small umbilical hernia containing fat. Electronically Signed   By: JMargarette CanadaM.D.   On: 07/18/2022 20:02    Procedures Procedures    Medications Ordered in ED Medications  sodium chloride 0.9 % bolus 1,000 mL (0 mLs Intravenous Stopped 07/18/22 2042)  iohexol (OMNIPAQUE) 300 MG/ML solution 100 mL (100 mLs Intravenous Contrast Given 07/18/22 1933)  amoxicillin-clavulanate (AUGMENTIN) 875-125 MG per tablet 1 tablet (1 tablet Oral Given 07/18/22 2052)  ketorolac (TORADOL) 15 MG/ML injection 15 mg (15 mg Intramuscular Given 07/18/22 2106)    ED Course/ Medical Decision Making/ A&P                             Medical Decision Making Amount and/or Complexity of Data Reviewed Labs: ordered. Radiology:  ordered.  Risk Prescription drug management.   Patient with rectal pain.  Since yesterday.  White count is somewhat elevated 15.5.  Sent from urgent care.  Differential diagnosis does include perirectal abscess colitis.  Will get CT scan to evaluate CT scan shows a proctitis but also potentially a small abscess.  Well-appearing and not septic.  However discussed with Dr. TGrandville Silosfrom general surgery over the phone about surgical versus GI follow-up.  He thinks follow-up with the surgery clinic is appropriate.  Patient will call and follow-up this week.  Will give antibiotics and pain medicine.        Final Clinical Impression(s) / ED Diagnoses Final diagnoses:  Proctitis    Rx / DC Orders ED Discharge Orders          Ordered    amoxicillin-clavulanate (AUGMENTIN) 875-125 MG tablet  Every 12 hours        07/18/22 2038    polyethylene glycol (MIRALAX) 17 g packet  Daily PRN        07/18/22 2038    oxyCODONE-acetaminophen (PERCOCET/ROXICET) 5-325  MG tablet  Every 8 hours PRN        07/18/22 2038              Davonna Belling, MD 07/18/22 [phone]

## 2022-07-18 NOTE — Discharge Instructions (Addendum)
There may be an abscess in your rectal area.  Watch for increasing pain or fevers.  Call the surgeons for follow-up this week.

## 2022-07-18 NOTE — ED Triage Notes (Signed)
Pt presents to ED POV. Pt c/o rectal pain/pressure since yesterday. Pt reports that he was sent here from UC to r/o internal abscess. Pt reports that he feels pressure like he needs to have bowel movement but confirmed by UC that he is not impacted.

## 2023-01-06 ENCOUNTER — Ambulatory Visit: Payer: BC Managed Care – PPO | Admitting: Podiatry

## 2023-01-11 ENCOUNTER — Ambulatory Visit: Payer: BC Managed Care – PPO | Admitting: Podiatry

## 2023-06-13 ENCOUNTER — Ambulatory Visit (INDEPENDENT_AMBULATORY_CARE_PROVIDER_SITE_OTHER): Payer: BC Managed Care – PPO | Admitting: Internal Medicine

## 2023-06-13 ENCOUNTER — Encounter: Payer: Self-pay | Admitting: Internal Medicine

## 2023-06-13 VITALS — BP 120/70 | HR 75 | Ht 71.0 in | Wt 210.0 lb

## 2023-06-13 DIAGNOSIS — E039 Hypothyroidism, unspecified: Secondary | ICD-10-CM

## 2023-06-13 NOTE — Patient Instructions (Signed)
Please stop at the lab.  Please  continue Levothyroxine 150 mcg daily.  Take the thyroid hormone every day, with water, at least 30 minutes before breakfast, separated by at least 4 hours from: - acid reflux medications - calcium - iron - multivitamins  Please come back for a follow-up appointment in 1 year.  

## 2023-06-13 NOTE — Progress Notes (Signed)
Patient ID: Micheal Watts, male   DOB: Oct 13, 1975, 48 y.o.   MRN: [id number]   HPI  SING Micheal Watts is a 48 y.o.-year-old male, initially referred by his PCP, Dr. Larwance Sachs, returning for f/u for acquired hypothyroidism. Last visit 1 year ago.  Interim history: He started a high stress job in 04/22.  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  He has problems sleeping when he is not working. He continues to have palpitations but only occasionally now.  He saw cardiology before last visit.  No clear pathology found. Before last visit, he gained weight, due to relaxing his diet.  He is currently on an anti-inflammatory diet with no grains, with green leafy vegetables and animal proteins.  He lost 25 pounds since last visit despite relaxing his diet over the holidays. Since last visit, he had proctitis and a rectal abscess 06/2022.  He was also found to have hepatic steatosis.   Reviewed and addended history: Pt. Has been dx with hypothyroidism in 2003-05 (possibly 2/2 Graves ds.) >> Synthroid DAW 137 mcg alternating with 125 mcg daily in 05/2016.  We decrease LT4 to 112 mcg and separated it from Protonix, however, we had to increase the dose to 125 mcg daily in 11/2016, to 137 mcg daily in 12/2016, and to 150 02/2018. In 05/2020, we switched from Synthroid to levothyroxine generic  Pt is on levothyroxine 150 mcg daily, taken: - in am - fasting - at least 30 min from b'fast - no calcium - no iron - no multivitamins - + PPIs later in the day (Protonix >> Omeprazole) occasionally - not on Biotin  Reviewed his TFTs: Lab Results  Component Value Date   TSH 0.92 06/10/2022   TSH 0.496 06/05/2021   TSH 3.17 05/30/2020   TSH 2.53 05/29/2019   TSH 0.96 03/07/2018   TSH 3.01 09/05/2017   TSH 2.59 04/11/2017   TSH 9.76 (H) 02/21/2017   TSH 13.29 (H) 01/17/2017   TSH 10.13 (H) 12/02/2016   FREET4 1.04 06/10/2022   FREET4 1.67 06/05/2021   FREET4 0.79 05/30/2020   FREET4 1.12 05/29/2019   FREET4  0.91 03/07/2018   FREET4 0.87 09/05/2017   FREET4 1.00 04/11/2017   FREET4 1.00 02/21/2017   FREET4 0.87 01/17/2017   FREET4 0.74 12/02/2016     Pt denies: - feeling nodules in neck - hoarseness - dysphagia - choking  He has + FH of thyroid disorders in: M and daughter. No FH of thyroid cancer. No h/o radiation tx to head or neck. No herbal supplements. No Biotin use. No recent steroids use.    Pt. also has a history of PACs, palpitations, GAD.  He was on propranolol as needed for anxiety/palpitations.  ROS: + see HPI  I reviewed pt's medications, allergies, PMH, social hx, family hx, and changes were documented in the history of present illness. Otherwise, unchanged from my initial visit note.  Past Medical History:  Diagnosis Date   Anemia    Anxiety    Asthma    Chicken pox    Costochondritis    GERD (gastroesophageal reflux disease)    Headache    Past Surgical History:  Procedure Laterality Date   ESOPHAGOGASTRODUODENOSCOPY (EGD) WITH PROPOFOL N/A 10/06/2015   Procedure: ESOPHAGOGASTRODUODENOSCOPY (EGD) WITH PROPOFOL;  Surgeon: Wallace Cullens, MD;  Location: [facility] ENDOSCOPY;  Service: Gastroenterology;  Laterality: N/A;   HERNIA REPAIR     pilonidial      Social History   Social History   Marital  status: Married    Spouse name: N/A   Number of children: 5-13   Occupational History    electrician    Social History Main Topics   Smoking status: Former Games developer, quit in 2003    Smokeless tobacco: Never Used   Alcohol use No   Drug use: No   Current Outpatient Medications on File Prior to Visit  Medication Sig Dispense Refill   ALPRAZolam (XANAX) 1 MG tablet TAKE 1 TABLET BY MOUTH EVERY DAY AS NEEDED FOR PANIC 20 tablet 2   amoxicillin-clavulanate (AUGMENTIN) 875-125 MG tablet Take 1 tablet by mouth every 12 (twelve) hours. 14 tablet 0   cyclobenzaprine (FLEXERIL) 10 MG tablet Take 10 mg by mouth 3 (three) times daily as needed for muscle spasms.      levothyroxine (SYNTHROID) 150 MCG tablet Take 1 tablet (150 mcg total) by mouth daily. 90 tablet 3   meloxicam (MOBIC) 15 MG tablet Take 15 mg by mouth as needed.     oxyCODONE-acetaminophen (PERCOCET/ROXICET) 5-325 MG tablet Take 1-2 tablets by mouth every 8 (eight) hours as needed for severe pain. 8 tablet 0   pantoprazole (PROTONIX) 40 MG tablet Take 40 mg by mouth daily.     polyethylene glycol (MIRALAX) 17 g packet Take 17 g by mouth daily as needed for mild constipation. 14 each 0   No current facility-administered medications on file prior to visit.   Allergies  Allergen Reactions   Moxifloxacin     Other reaction(s): hives   Family History  Problem Relation Age of Onset   Fibromyalgia Mother    PE: BP 120/70   Pulse 75   Ht 5\' 11"  (1.803 m)   Wt 210 lb (95.3 kg)   SpO2 96%   BMI 29.29 kg/m  Wt Readings from Last 3 Encounters:  06/13/23 210 lb (95.3 kg)  06/10/22 234 lb 9.6 oz (106.4 kg)  07/16/21 215 lb (97.5 kg)   Constitutional: Slightly overweight, in NAD Eyes:  EOMI, no exophthalmos ENT: no neck masses, no cervical lymphadenopathy Cardiovascular: RRR, No MRG Respiratory: CTA B Musculoskeletal: no deformities Skin:no rashes Neurological: no tremor with outstretched hands  ASSESSMENT: 1. Aquired Hypothyroidism  PLAN:  1. Patient with longstanding, previously uncontrolled hypothyroidism, developed after spontaneous remission of Graves' disease.  He was previously on Synthroid d.a.w. but currently on generic levothyroxine. -He was previously on Paxil and lithium and gained a significant amount of weight and also had palpitations, anxiety, hot flashes.  Around 2021, he started a vegetarian diet and felt much better afterwards and was able to come off his psychotropic medications.  The symptoms improved.  At last visit he gained a significant amount of weight, 17 pounds due to relaxing his diet and also having more stress. - latest thyroid labs reviewed with pt. >>  normal: Lab Results  Component Value Date   TSH 0.92 06/10/2022  - he continues on LT4 150 mcg daily - pt feels good on this dose.  He had palpitations at last visit, investigated by cardiology.  No significant pathology found.  He was not overly replaced with levothyroxine.  At today's visit he mentions that he has palpitations only occur occasionally.  We discussed that with his weight loss, he may require a lower dose of levothyroxine. - we discussed about taking the thyroid hormone every day, with water, >30 minutes before breakfast, separated by >4 hours from acid reflux medications, calcium, iron, multivitamins. Pt. is taking it correctly. - will check thyroid tests today: TSH  and fT4 - If labs are abnormal, he will need to return for repeat TFTs in 1.5 months - OTW, I will see him back in a year  Orders Placed This Encounter  Procedures   TSH   T4, free   Needs refills.  Carlus Pavlov, MD PhD [facility] Endocrinology

## 2023-06-14 ENCOUNTER — Encounter: Payer: Self-pay | Admitting: Internal Medicine

## 2023-06-14 LAB — TSH: TSH: 0.62 m[IU]/L (ref 0.40–4.50)

## 2023-06-14 LAB — T4, FREE: Free T4: 1.7 ng/dL (ref 0.8–1.8)

## 2023-06-14 MED ORDER — LEVOTHYROXINE SODIUM 150 MCG PO TABS: 150.0000 ug | ORAL_TABLET | Freq: Every day | ORAL | 3 refills | Status: AC

## 2023-06-14 NOTE — Addendum Note (Signed)
Addended by: Carlus Pavlov on: 06/14/2023 08:55 AM   Modules accepted: Orders

## 2024-02-24 ENCOUNTER — Other Ambulatory Visit: Payer: Self-pay | Admitting: Surgery

## 2024-03-02 ENCOUNTER — Other Ambulatory Visit: Payer: Self-pay | Admitting: Surgery

## 2024-03-12 ENCOUNTER — Encounter: Payer: Self-pay | Admitting: Internal Medicine

## 2024-03-12 DIAGNOSIS — E039 Hypothyroidism, unspecified: Secondary | ICD-10-CM

## 2024-03-13 ENCOUNTER — Other Ambulatory Visit: Payer: Self-pay

## 2024-03-13 DIAGNOSIS — E039 Hypothyroidism, unspecified: Secondary | ICD-10-CM

## 2024-04-05 ENCOUNTER — Encounter (HOSPITAL_BASED_OUTPATIENT_CLINIC_OR_DEPARTMENT_OTHER): Payer: Self-pay

## 2024-04-05 ENCOUNTER — Ambulatory Visit (HOSPITAL_BASED_OUTPATIENT_CLINIC_OR_DEPARTMENT_OTHER): Admit: 2024-04-05 | Admitting: Surgery

## 2024-04-05 SURGERY — REPAIR, HERNIA, UMBILICAL, ADULT
Anesthesia: General

## 2024-06-12 ENCOUNTER — Ambulatory Visit: Payer: BC Managed Care – PPO | Admitting: Internal Medicine

## 2024-06-26 ENCOUNTER — Ambulatory Visit: Admitting: Internal Medicine

## 2024-06-28 LAB — LAB REPORT - SCANNED
EGFR: 90
TSH: 2.14 (ref 0.41–5.90)

## 2024-06-29 ENCOUNTER — Ambulatory Visit: Payer: Self-pay | Admitting: Internal Medicine

## 2024-07-06 ENCOUNTER — Ambulatory Visit: Admitting: Internal Medicine
